# Patient Record
Sex: Male | Born: 1970 | Race: White | Hispanic: No | Smoking: Current every day smoker
Health system: Southern US, Community
[De-identification: ages and names within clinical notes are randomized; demographics above are authoritative.]

## PROBLEM LIST (undated history)

## (undated) ENCOUNTER — Emergency Department: Payer: Self-pay

## (undated) DIAGNOSIS — F419 Anxiety disorder, unspecified: Secondary | ICD-10-CM

## (undated) DIAGNOSIS — K589 Irritable bowel syndrome without diarrhea: Secondary | ICD-10-CM

## (undated) DIAGNOSIS — I10 Essential (primary) hypertension: Secondary | ICD-10-CM

## (undated) HISTORY — DX: Essential (primary) hypertension: I10

## (undated) HISTORY — PX: MOUTH SURGERY: SHX715

## (undated) HISTORY — DX: Anxiety disorder, unspecified: F41.9

## (undated) HISTORY — PX: VASECTOMY: SHX75

## (undated) HISTORY — PX: KNEE ARTHROSCOPY W/ ACL RECONSTRUCTION: SHX1858

---

## 2009-10-08 ENCOUNTER — Ambulatory Visit: Payer: Self-pay | Admitting: Family Medicine

## 2009-10-08 DIAGNOSIS — J069 Acute upper respiratory infection, unspecified: Secondary | ICD-10-CM | POA: Insufficient documentation

## 2009-10-08 DIAGNOSIS — R110 Nausea: Secondary | ICD-10-CM | POA: Insufficient documentation

## 2009-10-08 LAB — CONVERTED CEMR LAB: Glucose, Urine, Semiquant: NEGATIVE

## 2009-10-09 ENCOUNTER — Encounter: Payer: Self-pay | Admitting: Family Medicine

## 2009-10-09 LAB — CONVERTED CEMR LAB
ALT: 43 units/L (ref 0–53)
AST: 58 units/L — ABNORMAL HIGH (ref 0–37)
Alkaline Phosphatase: 69 units/L (ref 39–117)
Basophils Relative: 0 % (ref 0–1)
Chloride: 102 meq/L (ref 96–112)
Creatinine, Ser: 0.89 mg/dL (ref 0.40–1.50)
Glucose, Bld: 100 mg/dL — ABNORMAL HIGH (ref 70–99)
Hemoglobin: 15.3 g/dL (ref 13.0–17.0)
Lymphs Abs: 1.7 10*3/uL (ref 0.7–4.0)
MCHC: 34.7 g/dL (ref 30.0–36.0)
RBC: 5.02 M/uL (ref 4.22–5.81)
RDW: 12.4 % (ref 11.5–15.5)
Total Bilirubin: 0.5 mg/dL (ref 0.3–1.2)
Total Protein: 7.6 g/dL (ref 6.0–8.3)
WBC: 7.1 10*3/uL (ref 4.0–10.5)

## 2010-02-16 ENCOUNTER — Ambulatory Visit: Payer: Self-pay | Admitting: Family Medicine

## 2010-02-16 ENCOUNTER — Encounter: Admission: RE | Admit: 2010-02-16 | Discharge: 2010-02-16 | Payer: Self-pay | Admitting: Family Medicine

## 2010-05-11 ENCOUNTER — Ambulatory Visit: Payer: Self-pay | Admitting: Family Medicine

## 2010-05-11 DIAGNOSIS — H6500 Acute serous otitis media, unspecified ear: Secondary | ICD-10-CM | POA: Insufficient documentation

## 2010-05-11 DIAGNOSIS — H66009 Acute suppurative otitis media without spontaneous rupture of ear drum, unspecified ear: Secondary | ICD-10-CM | POA: Insufficient documentation

## 2010-05-11 DIAGNOSIS — H60339 Swimmer's ear, unspecified ear: Secondary | ICD-10-CM

## 2010-10-18 NOTE — Assessment & Plan Note (Signed)
Summary: RIGHT/LEFT EAR PAIN  HURTS WHEN YOU TOUCH IT (rm 3)   Vital Signs:  Patient Profile:   40 Years Old Male CC:      ear pain, sore throat x today Height:     69 inches Weight:      223 pounds O2 Sat:      98 % O2 treatment:    Room Air Temp:     98.6 degrees F oral Pulse rate:   86 / minute Resp:     12 per minute BP sitting:   119 / 83  (left arm) Cuff size:   large  Pt. in pain?   yes    Location:   both ears    Type:       sore  Vitals Entered By: Lajean Saver RN (May 11, 2010 4:30 PM)                   Updated Prior Medication List: No Medications Current Allergies (reviewed today): No known allergies History of Present Illness Chief Complaint: ear pain, sore throat x today History of Present Illness:  Subjective:  Patient complains of pain and drainage from his left ear for about 3 days.  He has been swimming recently.  Right ear now beginning to feel clogged.  Developing mild sore throat today.  No fevers, chills, and sweats.  Mild sinus congestion  REVIEW OF SYSTEMS Constitutional Symptoms      Denies fever, chills, night sweats, weight loss, weight gain, and fatigue.  Eyes       Denies change in vision, eye pain, eye discharge, glasses, contact lenses, and eye surgery. Ear/Nose/Throat/Mouth       Complains of ear pain and sore throat.      Denies hearing loss/aids, change in hearing, ear discharge, dizziness, frequent runny nose, frequent nose bleeds, sinus problems, hoarseness, and tooth pain or bleeding.      Comments: sore throat x today Respiratory       Denies dry cough, productive cough, wheezing, shortness of breath, asthma, bronchitis, and emphysema/COPD.  Cardiovascular       Denies murmurs, chest pain, and tires easily with exhertion.    Gastrointestinal       Denies stomach pain, nausea/vomiting, diarrhea, constipation, blood in bowel movements, and indigestion. Genitourniary       Denies painful urination, kidney stones, and loss of  urinary control. Neurological       Denies paralysis, seizures, and fainting/blackouts. Musculoskeletal       Denies muscle pain, joint pain, joint stiffness, decreased range of motion, redness, swelling, muscle weakness, and gout.  Skin       Denies bruising, unusual mles/lumps or sores, and hair/skin or nail changes.  Psych       Denies mood changes, temper/anger issues, anxiety/stress, speech problems, depression, and sleep problems. Other Comments: bilateral ear pain, mostly left, sore thoat x today   Past History:  Past Medical History: Reviewed history from 10/08/2009 and no changes required. Unremarkable  Past Surgical History: Reviewed history from 10/08/2009 and no changes required. Vasectomy Knee Surgery  Social History: Married Current Smoker 1PPD Alcohol use-no Drug use-no Regular exercise-no   Objective:  No acute distress  Eyes:  Pupils are equal, round, and reactive to light and accomdation.  Extraocular movement is intact.  Conjunctivae are not inflamed.  Ears:   Right external auditory canal.  Right tympanic membrane normal but has clear serous effusion Left external auditory canal has exudate and mildly edematous.  Left tympanic membrane erythematous Nose:  Congested turbinates but no sinus tenderness Pharynx:  Minimal erythema Neck:  Supple.  No adenopathy is present. Lungs:  Clear to auscultation.  Breath sounds are equal.  Heart:  Regular rate and rhythm without murmurs, rubs, or gallops.  Rapid strep test negative  Assessment New Problems: OTITIS MEDIA, SEROUS, ACUTE, RIGHT (ICD-381.01) OTITIS EXTERNA, ACUTE, LEFT (ICD-380.12) OTITIS MEDIA, SUPPURATIVE, ACUTE, LEFT (ICD-382.00)   Plan New Medications/Changes: AMOXICILLIN 875 MG TABS (AMOXICILLIN) One by mouth two times a day  #20 x 0, 05/11/2010, Donna Christen MD CIPRODEX 0.3-0.1 % SUSP (CIPROFLOXACIN-DEXAMETHASONE) 4 gtts in affected ear two times a day for 7 days  #7.5cc x 0, 05/11/2010,  Donna Christen MD  New Orders: Est. Patient Level III [54098] Rapid Strep [11914] Planning Comments:   Begin oral amoxicillin.  Ciprodex drops left ear for one week.  Expectorant/decongestant. Follow-up with PCP if not improving.   The patient and/or caregiver has been counseled thoroughly with regard to medications prescribed including dosage, schedule, interactions, rationale for use, and possible side effects and they verbalize understanding.  Diagnoses and expected course of recovery discussed and will return if not improved as expected or if the condition worsens. Patient and/or caregiver verbalized understanding.  Prescriptions: AMOXICILLIN 875 MG TABS (AMOXICILLIN) One by mouth two times a day  #20 x 0   Entered and Authorized by:   Donna Christen MD   Signed by:   Donna Christen MD on 05/11/2010   Method used:   Print then Give to Patient   RxID:   [id number] CIPRODEX 0.3-0.1 % SUSP (CIPROFLOXACIN-DEXAMETHASONE) 4 gtts in affected ear two times a day for 7 days  #7.5cc x 0   Entered and Authorized by:   Donna Christen MD   Signed by:   Donna Christen MD on 05/11/2010   Method used:   Print then Give to Patient   RxID:   [id number]   Patient Instructions: 1)  May use Mucinex D (guaifenesin with decongestant) twice daily for congestion. 2)  Increase fluid intake  3)  May use Afrin nasal spray (or generic oxymetazoline) twice daily for about 5 days.  Also recommend using saline nasal spray several times daily and/or saline nasal irrigation. 4)  Followup with family doctor if not improving one week.   Orders Added: 1)  Est. Patient Level III [02725] 2)  Rapid Strep [36644]
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
## 2010-10-18 NOTE — Letter (Signed)
Summary: Handout Printed  Printed Handout:  - Rheumatic Fever 

## 2010-10-18 NOTE — Assessment & Plan Note (Signed)
Summary: Stanley Munoz   Vital Signs:  Patient Profile:   40 Years Old Male CC:      Allergic reaction to Antibiotic - L side shaking O2 Sat:      97 % O2 treatment:    Room Air Temp:     97.0 degrees F oral Pulse rate:   83 / minute Pulse rhythm:   regular Resp:     16 per minute BP sitting:   127 / 87  (right arm) Cuff size:   regular  Vitals Entered By: Areta Haber CMA (October 08, 2009 7:12 PM)                  Current Allergies: No known allergies History of Present Illness Chief Complaint: Allergic reaction to Antibiotic - L side shaking History of Present Illness: Subjective:  Patient developed URI symptoms one week ago.  He visited his PCP 2 days ago and started on a Z-Pack.  Today while at work, he had "cotton mouth" all day, then about 3:30pm developed a frontal headache and had nausea and one episode of vomiting.  He feels somewhat weak, but the headache has resolved.  He denies fevers, chills, and sweats.  No localizing neuro symptoms.  He still has a mild cough and nasal congestion.  He states that his ears are ringing and feel congested.  No urinary symptoms.  No chest pain.  Current Problems: NAUSEA ALONE (ICD-787.02) URI (ICD-465.9)   Current Meds ZITHROMAX Z-PAK 250 MG TABS (AZITHROMYCIN) as directed  REVIEW OF SYSTEMS Constitutional Symptoms      Denies fever, chills, night sweats, weight loss, weight gain, and fatigue.  Eyes       Denies change in vision, eye pain, eye discharge, glasses, contact lenses, and eye surgery. Ear/Nose/Throat/Mouth       Denies hearing loss/aids, change in hearing, ear pain, ear discharge, dizziness, frequent runny nose, frequent nose bleeds, sinus problems, sore throat, hoarseness, and tooth pain or bleeding.  Respiratory       Denies dry cough, productive cough, wheezing, shortness of breath, asthma, bronchitis, and emphysema/COPD.  Cardiovascular       Denies murmurs, chest pain, and tires  easily with exhertion.    Gastrointestinal       Denies stomach pain, nausea/vomiting, diarrhea, constipation, blood in bowel movements, and indigestion. Genitourniary       Denies painful urination, kidney stones, and loss of urinary control. Neurological       Denies paralysis, seizures, and fainting/blackouts. Musculoskeletal       Denies muscle pain, joint pain, joint stiffness, decreased range of motion, redness, swelling, muscle weakness, and gout.  Skin       Denies bruising, unusual mles/lumps or sores, and hair/skin or nail changes.  Psych       Denies mood changes, temper/anger issues, anxiety/stress, speech problems, depression, and sleep problems. Other Comments: Pt states that he started shaking on his Left side this afternoon and did not know if he was having an allergic reaction to antibiotic that he was prescribed by PCP on Wednesday 10/06/09.   Past History:  Past Medical History: Unremarkable  Past Surgical History: Vasectomy Knee Surgery  Social History: Married Current Smoker Alcohol use-no Drug use-no Regular exercise-no Smoking Status:  current Drug Use:  no Does Patient Exercise:  no   Objective:  Appearance:  Patient appears healthy, stated age, and in no acute distress  Eyes:  Pupils are equal, round, and reactive to light and  accomdation.  Extraocular movement is intact.  Conjunctivae are not inflamed.  Ears:  Canals normal.  Tympanic membranes normal.   Nose:  Normal septum.  Normal turbinates, mildly congested.    No sinus tenderness present.  Pharynx:  Normal, moist mucous membranes  Neck:  Supple.  No adenopathy is present.  No thyromegaly is present  Lungs:  Clear to auscultation.  Breath sounds are equal.  Heart:  Regular rate and rhythm without murmurs, rubs, or gallops.  Abdomen:  Nontender without masses or hepatosplenomegaly.  Bowel sounds are present.  No CVA or flank tenderness.  Extremities:  No edema.   urinalysis (dipstick):  normal except specific gravity greater than 1.030 Assessment New Problems: NAUSEA ALONE (ICD-787.02) URI (ICD-465.9)  Suspect dehydration as a cause of his symptoms.  Plan New Orders: T-CBC w/Diff [[id number]] T-Comprehensive Metabolic Panel [[id number]] Urinalysis [81003-65000] New Patient Level III [64403] Planning Comments:   For now stop the Z-pack.  Check CBC and CMP. Rest tonight and push fluids.  If symptoms become significantly worse during the night or over the weekend, proceed to the local emergency room.   The patient and/or caregiver has been counseled thoroughly with regard to medications prescribed including dosage, schedule, interactions, rationale for use, and Stanley side effects and they verbalize understanding.  Diagnoses and expected course of recovery discussed and will return if not improved as expected or if the condition worsens. Patient and/or caregiver verbalized understanding.   Laboratory Results   Urine Tests  Date/Time Received: October 08, 2009 7:23 PM  Date/Time Reported: October 08, 2009 7:23 PM   Routine Urinalysis   Color: yellow Appearance: Cloudy Glucose: negative   (Normal Range: Negative) Bilirubin: small   (Normal Range: Negative) Ketone: negative   (Normal Range: Negative) Spec. Gravity: >=1.030   (Normal Range: 1.003-1.035) Blood: negative   (Normal Range: Negative) pH: 6.0   (Normal Range: 5.0-8.0) Protein: 30   (Normal Range: Negative) Urobilinogen: 1.0   (Normal Range: 0-1) Nitrite: negative   (Normal Range: Negative) Leukocyte Esterace: negative   (Normal Range: Negative)

## 2010-10-18 NOTE — Assessment & Plan Note (Signed)
Summary: New Antibiotic  Followup call to patient:  He feels better after drinking plenty of fluids, but still has cough.  No fever.  Discussed lab results (normal CBC and unremarkable CMP except slightly elevated glucose and AST)  Assessment:   Suspect viral URI/? bronchitis  Plan:  AMOXICILLIN 875 MG TABS (AMOXICILLIN) One by mouth two times a day Rest, continue increased fluids.  Follow-up with PCP if not resolved one week.  Prescriptions: AMOXICILLIN 875 MG TABS (AMOXICILLIN) One by mouth two times a day  #14 x 0   Entered and Authorized by:   Donna Christen MD   Signed by:   Donna Christen MD on 10/09/2009   Method used:   Electronically to        [facility] [phone]* (retail)       [address] [place], [place]  [address]       Ph: [phone] or [phone]       Fax: [phone]   RxID:   [id number]
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
## 2011-10-16 ENCOUNTER — Encounter: Payer: Self-pay | Admitting: *Deleted

## 2011-10-16 ENCOUNTER — Emergency Department (INDEPENDENT_AMBULATORY_CARE_PROVIDER_SITE_OTHER)
Admission: EM | Admit: 2011-10-16 | Discharge: 2011-10-16 | Disposition: A | Discharge status: Home / Self Care | Payer: BC Managed Care – PPO | Source: Home / Self Care | Attending: Family Medicine | Admitting: Family Medicine

## 2011-10-16 DIAGNOSIS — R109 Unspecified abdominal pain: Secondary | ICD-10-CM

## 2011-10-16 DIAGNOSIS — R5383 Other fatigue: Secondary | ICD-10-CM

## 2011-10-16 LAB — POCT CBC W AUTO DIFF (K'VILLE URGENT CARE)

## 2011-10-16 LAB — POCT URINALYSIS DIPSTICK
Bilirubin, UA: NEGATIVE
Nitrite, UA: NEGATIVE

## 2011-10-16 NOTE — ED Provider Notes (Signed)
History     CSN: [id number]  Arrival date & time 10/16/11  1135   First MD Initiated Contact with Patient 10/16/11 1240      Chief Complaint  Patient presents with  . Weakness     HPI Comments: Patient complains of awakening 5 days ago with nausea at 4AM.  Later at midday after lunch he vomited once.  His stools have been slightly loose, but not diarrhea, and he has had mild abdominal discomfort.  His appetite is decreased.  For about 4 days he had a low grade fever to 100, but not for the past 24 hours.  He has had some mild nasal congestion and cough.  No urinary symptoms.  No rash.  His main complaint now is persistent fatigue.  Patient is a 41 y.o. male presenting with weakness. The history is provided by the patient.  Weakness The primary symptoms include headaches, fever, nausea and vomiting. Primary symptoms do not include syncope, loss of consciousness, altered mental status, dizziness, visual change or focal weakness. Episode onset: 5 days ago. The symptoms are unchanged. The neurological symptoms are diffuse.  The headache is associated with weakness. The headache is not associated with visual change.  Additional symptoms include weakness.    History reviewed. No pertinent past medical history.  Past Surgical History  Procedure Date  . Vasectomy   . Knee arthroscopy w/ acl reconstruction     left    No family history on file.  History  Substance Use Topics  . Smoking status: Current Everyday Smoker -- 25 years  . Smokeless tobacco: Not on file  . Alcohol Use: No      Review of Systems  Constitutional: Positive for fever.  Cardiovascular: Negative for syncope.  Gastrointestinal: Positive for nausea and vomiting.  Neurological: Positive for weakness and headaches. Negative for dizziness, focal weakness and loss of consciousness.  Psychiatric/Behavioral: Negative for altered mental status.   No sore throat Minimal cough No pleuritic pain No wheezing Minimal  nasal congestion No post-nasal drainage No sinus pain/pressure No itchy/red eyes No earache No hemoptysis No SOB + fever/chills to 100 + nausea, resolved + vomiting, resolved + abdominal pain, mild No diarrhea, but stools somewhat loose No urinary symptoms No skin rashes + fatigue No myalgias + headache Used OTC meds without relief  Allergies  Review of patient's allergies indicates no known allergies.  Home Medications  No current outpatient prescriptions on file.  BP 126/84  Pulse 75  Temp(Src) 98.7 F (37.1 C) (Oral)  Resp 14  Ht 5\' 9"  (1.753 m)  Wt 234 lb (106.142 kg)  BMI 34.56 kg/m2  SpO2 96%  Physical Exam Nursing notes and Vital Signs reviewed. Appearance:  Patient appears healthy, stated age, and in no acute distress Eyes:  Pupils are equal, round, and reactive to light and accomodation.  Extraocular movement is intact.  Conjunctivae are not inflamed  Ears:  Canals normal.  Tympanic membranes normal.  Nose:  Mildly congested turbinates.  No sinus tenderness.  Pharynx:  Normal Neck:  Supple. No adenopathy Lungs:  Clear to auscultation.  Breath sounds are equal.  Heart:  Regular rate and rhythm without murmurs, rubs, or gallops.  Abdomen:   No masses or hepatosplenomegaly.  There is mild tenderness over the spleen and right lower quadrant.  Bowel sounds are present.  No CVA or flank tenderness.  Extremities:  No edema.  No calf tenderness Lymph:  There is mild tenderness bilateral lateral axillary nodes. Skin:  No rash  present.   ED Course  Procedures  none   Labs Reviewed  POCT CBC W AUTO DIFF ([facility]) CBC:  WBC 6.0; LY 35.2; MO 8.8; GR 56.0; Hgb 16.0   POCT URINALYSIS DIPSTICK negative      1. Fatigue   2. Abdominal pain       MDM  Suspect a resolving viral syndrome. Treat symptomatically for now:  Continue a BRAT diet and gradually advance as tolerated.  Rest. Followup with PCP if not improving one week, or if symptoms  worsen.        Donna Christen, MD 10/16/11 [phone]

## 2011-10-16 NOTE — ED Notes (Signed)
Patient c/o vomiting once 5 days ago. He then developed a fever that lasted 3 days and weakness that still persists. Bowel movement pattern is normal and appetite/intake is normal.

## 2012-02-15 ENCOUNTER — Encounter: Payer: Self-pay | Admitting: *Deleted

## 2012-02-15 ENCOUNTER — Emergency Department (INDEPENDENT_AMBULATORY_CARE_PROVIDER_SITE_OTHER)
Admission: EM | Admit: 2012-02-15 | Discharge: 2012-02-15 | Disposition: A | Discharge status: Home / Self Care | Payer: BC Managed Care – PPO | Source: Home / Self Care | Attending: Emergency Medicine | Admitting: Emergency Medicine

## 2012-02-15 DIAGNOSIS — M545 Low back pain, unspecified: Secondary | ICD-10-CM

## 2012-02-15 MED ORDER — CYCLOBENZAPRINE HCL 10 MG PO TABS: 10.0000 mg | ORAL_TABLET | Freq: Three times a day (TID) | ORAL | Status: AC | PRN

## 2012-02-15 MED ORDER — TRAMADOL HCL 50 MG PO TABS: 50.0000 mg | ORAL_TABLET | Freq: Four times a day (QID) | ORAL | Status: AC | PRN

## 2012-02-15 MED ORDER — PREDNISONE 20 MG PO TABS: 20.0000 mg | ORAL_TABLET | Freq: Two times a day (BID) | ORAL | Status: AC

## 2012-02-15 NOTE — ED Provider Notes (Signed)
History     CSN: [id number]  Arrival date & time 02/15/12  0921   First MD Initiated Contact with Patient 02/15/12 0940      Chief Complaint  Patient presents with  . Back Pain    (Consider location/radiation/quality/duration/timing/severity/associated sxs/prior treatment) HPI The patient presents today with back pain.  He was lifting a 20 lb piece of furniture a few days ago and then developed lower left back & buttock pain.  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 Location: L lower back Timing: constant Description: tight, spasm, sharp Worse with: moving, lifting Better with: rest, massage, hot tub, Tylenol Trauma: no Bladder/bowel incontinence: no Weakness: no Fever/chills: no Night pain: no Unexplained weight loss: no Cancer/immunosuppression: no PMH of osteoporosis or chronic steroid use:  no   History reviewed. No pertinent past medical history.  Past Surgical History  Procedure Date  . Vasectomy   . Knee arthroscopy w/ acl reconstruction     left    History reviewed. No pertinent family history.  History  Substance Use Topics  . Smoking status: Current Everyday Smoker -- 25 years  . Smokeless tobacco: Not on file  . Alcohol Use: No      Review of Systems  All other systems reviewed and are negative.    Allergies  Review of patient's allergies indicates no known allergies.  Home Medications  No current outpatient prescriptions on file.  BP 117/84  Pulse 78  Resp 14  Ht 5\' 9"  (1.753 m)  Wt 235 lb (106.595 kg)  BMI 34.70 kg/m2  SpO2 99%  Physical Exam  Nursing note and vitals reviewed. Constitutional: He is oriented to person, place, and time. He appears well-developed and well-nourished.  HENT:  Head: Normocephalic and atraumatic.  Eyes: No scleral icterus.  Neck: Neck supple.  Cardiovascular: Regular rhythm and normal heart sounds.   Pulmonary/Chest: Effort normal and breath sounds normal. No respiratory  distress.  Musculoskeletal:       Lumbar back: He exhibits tenderness, pain and spasm. He exhibits no bony tenderness, no swelling and no edema.       Back:       SLR negative bilateral, distal pulses & sensation normal.  Neurological: He is alert and oriented to person, place, and time. He has normal strength and normal reflexes. No sensory deficit.  Skin: Skin is warm and dry. No rash noted.  Psychiatric: He has a normal mood and affect. His speech is normal.    ED Course  Procedures (including critical care time)  Labs Reviewed - No data to display No results found.   1. Pain in lower back       MDM   This patient is having musculoskeletal lower back pain.  I gave him a prescription for Flexeril, tramadol, and prednisone for a few days.  I gave him a work note for yesterday through tomorrow and told him to avoid heavy lifting, pushing, pulling.  I would like him to continue to sit in his hot or use a heating pad, encourage massage, gentle range of motion.  It is not improving, PT versus referral to sports medicine.  I advised him that this will likely be going on matter what for another 5-7 days despite treatment anyway.     Marlaine Hind, MD 02/15/12 [phone]

## 2012-02-15 NOTE — ED Notes (Signed)
patient was moving furniture 2 days ago and felt a catch in his left lower back. C/o left glute pain that radiates on his left leg. Taken tylenol for the pain.

## 2012-06-06 ENCOUNTER — Emergency Department (INDEPENDENT_AMBULATORY_CARE_PROVIDER_SITE_OTHER)
Admission: EM | Admit: 2012-06-06 | Discharge: 2012-06-06 | Disposition: A | Discharge status: Home / Self Care | Payer: BC Managed Care – PPO | Source: Home / Self Care | Attending: Family Medicine | Admitting: Family Medicine

## 2012-06-06 DIAGNOSIS — J209 Acute bronchitis, unspecified: Secondary | ICD-10-CM

## 2012-06-06 MED ORDER — AZITHROMYCIN 250 MG PO TABS: ORAL_TABLET | ORAL | Status: DC

## 2012-06-06 MED ORDER — PREDNISONE 20 MG PO TABS: 20.0000 mg | ORAL_TABLET | Freq: Two times a day (BID) | ORAL | Status: DC

## 2012-06-06 MED ORDER — BENZONATATE 200 MG PO CAPS: 200.0000 mg | ORAL_CAPSULE | Freq: Every day | ORAL | Status: DC

## 2012-06-06 NOTE — ED Notes (Signed)
Stanley Munoz complains of a productive cough for 2 days and now has wheezing. Denies fever, chills or sweats.

## 2012-06-06 NOTE — ED Provider Notes (Signed)
History     CSN: [id number]  Arrival date & time 06/06/12  1216   First MD Initiated Contact with Patient 06/06/12 1229      Chief Complaint  Patient presents with  . Wheezing    x 1 day  . Cough    x 2days      HPI Comments: Patient complains of 2 day history of gradually progressive URI symptoms beginning with nasal congestion and a cough.  No sore throat.  Complains of fatigue but no myalgias.  Cough is now worse at night and generally non-productive during the day.  There has been no pleuritic pain, but he has developed wheezing and mild shortness of breath with activity today.  He continues to smoke.   History reviewed. No pertinent past medical history.  Past Surgical History  Procedure Date  . Vasectomy   . Knee arthroscopy w/ acl reconstruction     left    History reviewed. No pertinent family history.  History  Substance Use Topics  . Smoking status: Current Every Day Smoker -- 1.0 packs/day for 28 years  . Smokeless tobacco: Never Used  . Alcohol Use: No      Review of Systems No sore throat + cough No pleuritic pain + wheezing No nasal congestion No post-nasal drainage No sinus pain/pressure No itchy/red eyes No earache No hemoptysis + mild SOB with activity ? Low grade fever  No nausea No vomiting No abdominal pain No diarrhea No urinary symptoms No skin rashes + fatigue No myalgias + headache Used OTC meds without relief  Allergies  Review of patient's allergies indicates no known allergies.  Home Medications   Current Outpatient Rx  Name Route Sig Dispense Refill  . AZITHROMYCIN 250 MG PO TABS  Take 2 tabs today; then begin one tab once daily for 4 more days. 6 each 0  . BENZONATATE 200 MG PO CAPS Oral Take 1 capsule (200 mg total) by mouth at bedtime. Take as needed for cough 12 capsule 0  . PREDNISONE 20 MG PO TABS Oral Take 1 tablet (20 mg total) by mouth 2 (two) times daily. Take with food. 10 tablet 0    BP 112/77  Pulse  95  Temp 98.5 F (36.9 C) (Oral)  Resp 18  Ht 5\' 9"  (1.753 m)  Wt 236 lb (107.049 kg)  BMI 34.85 kg/m2  SpO2 96%  Physical Exam Nursing notes and Vital Signs reviewed. Appearance:  Patient appears healthy, stated age, and in no acute distress Eyes:  Pupils are equal, round, and reactive to light and accomodation.  Extraocular movement is intact.  Conjunctivae are not inflamed  Ears:  Canals normal.  Tympanic membranes normal.  Nose:   Nasal clear.  No sinus tenderness.  Pharynx:  Normal Neck:  Supple.   No adenopathy Lungs:   Faint scattered wheezes bilaterally.  Breath sounds are equal.  Heart:  Regular rate and rhythm without murmurs, rubs, or gallops.  Abdomen:  Nontender without masses or hepatosplenomegaly.  Bowel sounds are present.  No CVA or flank tenderness.  Extremities:  No edema.  No calf tenderness Skin:  No rash present.   ED Course  Procedures  none      1. Bronchospasm with bronchitis, acute       MDM  ? Atypical agent. Begin a Z-pack, prednisone burst.  Prescription written for Benzonatate (Tessalon) to take at bedtime for night-time cough.  Take Mucinex D (guaifenesin with decongestant) twice daily for congestion.  Increase fluid  intake, rest. May use Afrin nasal spray (or generic oxymetazoline) twice daily for about 5 days.  Also recommend using saline nasal spray several times daily and saline nasal irrigation (AYR is a common brand) Stop all antihistamines for now, and other non-prescription cough/cold preparations. Recommend smoking cessation. Follow-up with family doctor if not improving 7 to 10 days.         Lattie Haw, MD 06/06/12 [phone]

## 2013-12-15 ENCOUNTER — Encounter: Payer: Self-pay | Admitting: Emergency Medicine

## 2013-12-15 ENCOUNTER — Emergency Department
Admission: EM | Admit: 2013-12-15 | Discharge: 2013-12-15 | Disposition: A | Discharge status: Home / Self Care | Payer: BC Managed Care – PPO | Source: Home / Self Care | Attending: Family Medicine | Admitting: Family Medicine

## 2013-12-15 DIAGNOSIS — R3915 Urgency of urination: Secondary | ICD-10-CM

## 2013-12-15 HISTORY — DX: Irritable bowel syndrome, unspecified: K58.9

## 2013-12-15 LAB — POCT CBC W AUTO DIFF (K'VILLE URGENT CARE)

## 2013-12-15 LAB — COMPLETE METABOLIC PANEL WITH GFR
ALBUMIN: 4.3 g/dL (ref 3.5–5.2)
ALT: 35 U/L (ref 0–53)
AST: 43 U/L — AB (ref 0–37)
Alkaline Phosphatase: 72 U/L (ref 39–117)
BILIRUBIN TOTAL: 0.4 mg/dL (ref 0.2–1.2)
BUN: 12 mg/dL (ref 6–23)
CALCIUM: 9.6 mg/dL (ref 8.4–10.5)
CO2: 29 mEq/L (ref 19–32)
CREATININE: 0.93 mg/dL (ref 0.50–1.35)
Chloride: 102 mEq/L (ref 96–112)
GFR, Est African American: >89 mL/min
GLUCOSE: 85 mg/dL (ref 70–99)
POTASSIUM: 5 meq/L (ref 3.5–5.3)
Sodium: 138 mEq/L (ref 135–145)
Total Protein: 7 g/dL (ref 6.0–8.3)

## 2013-12-15 LAB — POCT URINALYSIS DIP (MANUAL ENTRY)
BILIRUBIN UA: NEGATIVE
Blood, UA: NEGATIVE
Glucose, UA: NEGATIVE
Ketones, POC UA: NEGATIVE
Leukocytes, UA: NEGATIVE
Nitrite, UA: NEGATIVE
PROTEIN UA: NEGATIVE
Spec Grav, UA: >=1.03 (ref 1.005–1.03)
Urobilinogen, UA: 0.2 (ref 0–1)
pH, UA: 5.5 (ref 5–8)

## 2013-12-15 LAB — CK: Total CK: 117 U/L (ref 7–232)

## 2013-12-15 NOTE — ED Notes (Signed)
Pt c/o frequent urination x 2-3 days, with some bowel changes. Denies pain, burning, or fever.
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
## 2013-12-16 LAB — URINE CULTURE
COLONY COUNT: NO GROWTH
Organism ID, Bacteria: NO GROWTH

## 2013-12-19 ENCOUNTER — Telehealth: Payer: Self-pay | Admitting: *Deleted

## 2014-09-09 ENCOUNTER — Emergency Department
Admission: EM | Admit: 2014-09-09 | Discharge: 2014-09-09 | Disposition: A | Discharge status: Home / Self Care | Payer: BC Managed Care – PPO | Source: Home / Self Care | Attending: Emergency Medicine | Admitting: Emergency Medicine

## 2014-09-09 ENCOUNTER — Ambulatory Visit (HOSPITAL_BASED_OUTPATIENT_CLINIC_OR_DEPARTMENT_OTHER): Admission: RE | Admit: 2014-09-09 | Payer: BC Managed Care – PPO | Source: Ambulatory Visit

## 2014-09-09 ENCOUNTER — Ambulatory Visit (HOSPITAL_BASED_OUTPATIENT_CLINIC_OR_DEPARTMENT_OTHER)
Admission: RE | Admit: 2014-09-09 | Discharge: 2014-09-09 | Disposition: A | Discharge status: Home / Self Care | Payer: BC Managed Care – PPO | Source: Ambulatory Visit | Attending: Emergency Medicine | Admitting: Emergency Medicine

## 2014-09-09 ENCOUNTER — Encounter: Payer: Self-pay | Admitting: *Deleted

## 2014-09-09 ENCOUNTER — Other Ambulatory Visit: Payer: Self-pay | Admitting: Emergency Medicine

## 2014-09-09 DIAGNOSIS — R208 Other disturbances of skin sensation: Secondary | ICD-10-CM

## 2014-09-09 DIAGNOSIS — Z87891 Personal history of nicotine dependence: Secondary | ICD-10-CM | POA: Diagnosis not present

## 2014-09-09 DIAGNOSIS — M25451 Effusion, right hip: Secondary | ICD-10-CM

## 2014-09-09 DIAGNOSIS — M79651 Pain in right thigh: Secondary | ICD-10-CM | POA: Insufficient documentation

## 2014-09-09 DIAGNOSIS — I8001 Phlebitis and thrombophlebitis of superficial vessels of right lower extremity: Secondary | ICD-10-CM

## 2014-09-09 DIAGNOSIS — M79604 Pain in right leg: Secondary | ICD-10-CM

## 2014-09-09 DIAGNOSIS — R200 Anesthesia of skin: Secondary | ICD-10-CM
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
## 2014-09-09 NOTE — ED Notes (Signed)
Pt c/o swelling and numbness in his RT upper leg x 1 day with a "tender spot".

## 2015-02-10 ENCOUNTER — Other Ambulatory Visit: Payer: Self-pay | Admitting: Sports Medicine

## 2015-02-10 ENCOUNTER — Emergency Department
Admission: EM | Admit: 2015-02-10 | Discharge: 2015-02-10 | Disposition: A | Discharge status: Home / Self Care | Payer: BLUE CROSS/BLUE SHIELD | Source: Home / Self Care | Attending: Family Medicine | Admitting: Family Medicine

## 2015-02-10 ENCOUNTER — Encounter: Payer: Self-pay | Admitting: *Deleted

## 2015-02-10 ENCOUNTER — Ambulatory Visit: Payer: BLUE CROSS/BLUE SHIELD

## 2015-02-10 DIAGNOSIS — M25451 Effusion, right hip: Secondary | ICD-10-CM | POA: Diagnosis not present

## 2015-02-10 DIAGNOSIS — M79651 Pain in right thigh: Secondary | ICD-10-CM | POA: Diagnosis not present

## 2015-02-10 MED ORDER — DOXYCYCLINE HYCLATE 100 MG PO TABS: 100.0000 mg | ORAL_TABLET | Freq: Two times a day (BID) | ORAL | Status: AC

## 2015-02-10 MED ORDER — ASPIRIN 325 MG PO TBEC: 325.0000 mg | DELAYED_RELEASE_TABLET | Freq: Three times a day (TID) | ORAL | Status: DC
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
## 2015-02-10 NOTE — Assessment & Plan Note (Signed)
History of superficial thrombophlebitis. We do need an official ultrasound of the right thigh both superficial and deep veins up to the iliacs and the saphenofemoral junction. High-dose aspirin. Thigh-high compression hose.  Return to see me in 2 weeks. If persistent symptoms we will obtain an MR venogram.
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
## 2015-02-18 ENCOUNTER — Telehealth: Payer: Self-pay | Admitting: Emergency Medicine

## 2016-01-27 ENCOUNTER — Ambulatory Visit (HOSPITAL_BASED_OUTPATIENT_CLINIC_OR_DEPARTMENT_OTHER): Payer: BLUE CROSS/BLUE SHIELD | Attending: Emergency Medicine

## 2016-01-27 ENCOUNTER — Emergency Department
Admission: EM | Admit: 2016-01-27 | Discharge: 2016-01-27 | Disposition: A | Discharge status: Home / Self Care | Payer: BLUE CROSS/BLUE SHIELD | Source: Home / Self Care | Attending: Family Medicine | Admitting: Family Medicine

## 2016-01-27 ENCOUNTER — Encounter: Payer: Self-pay | Admitting: *Deleted

## 2016-01-27 ENCOUNTER — Emergency Department (INDEPENDENT_AMBULATORY_CARE_PROVIDER_SITE_OTHER): Payer: BLUE CROSS/BLUE SHIELD

## 2016-01-27 DIAGNOSIS — R0789 Other chest pain: Secondary | ICD-10-CM

## 2016-01-27 DIAGNOSIS — R209 Unspecified disturbances of skin sensation: Secondary | ICD-10-CM | POA: Diagnosis not present

## 2016-01-27 DIAGNOSIS — M79602 Pain in left arm: Secondary | ICD-10-CM | POA: Diagnosis not present

## 2016-01-27 DIAGNOSIS — M546 Pain in thoracic spine: Secondary | ICD-10-CM

## 2016-01-27 DIAGNOSIS — F410 Panic disorder [episodic paroxysmal anxiety] without agoraphobia: Secondary | ICD-10-CM

## 2016-01-27 DIAGNOSIS — R200 Anesthesia of skin: Secondary | ICD-10-CM

## 2016-01-27 DIAGNOSIS — R202 Paresthesia of skin: Principal | ICD-10-CM

## 2016-01-27 DIAGNOSIS — M549 Dorsalgia, unspecified: Secondary | ICD-10-CM

## 2016-01-27 LAB — POCT CBC W AUTO DIFF (K'VILLE URGENT CARE)

## 2016-01-27 MED ORDER — MELOXICAM 7.5 MG PO TABS: 7.5000 mg | ORAL_TABLET | Freq: Every day | ORAL | Status: DC

## 2016-01-27 MED ORDER — CYCLOBENZAPRINE HCL 10 MG PO TABS: 10.0000 mg | ORAL_TABLET | Freq: Two times a day (BID) | ORAL | Status: DC | PRN

## 2016-01-27 MED ORDER — PREDNISONE 20 MG PO TABS: ORAL_TABLET | ORAL | Status: DC
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
## 2016-01-27 NOTE — ED Notes (Signed)
Pt c/o 3 days of numbness, tingling and pain in his left hand that initially started with swelling. As the swelling decreased the discomfort traveled up his arm and is not in his upper side/left back. Denies SOB or CP. He also reports awaking with tremors in the morning which resolve after a few minutes.

## 2016-01-27 NOTE — Discharge Instructions (Signed)
Meloxicam (Mobic) is an antiinflammatory to help with pain and inflammation.  Do not take ibuprofen, Advil, Aleve, or any other medications that contain NSAIDs while taking meloxicam as this may cause stomach upset or even ulcers if taken in large amounts for an extended period of time.   Prednisone is a steroid being prescribed.  This medication can help decrease inflammation in your muscles and hopefully help with some of the back pain.   Flexeril is a muscle relaxer and may cause drowsiness. Do not drink alcohol, drive, or operate heavy machinery while taking.  Flexeril is NOT a narcotic medication.  Low risk of dependency.

## 2016-01-28 ENCOUNTER — Telehealth: Payer: Self-pay | Admitting: *Deleted

## 2016-01-28 LAB — COMPLETE METABOLIC PANEL WITH GFR
ALT: 20 U/L (ref 9–46)
AST: 33 U/L (ref 10–40)
Albumin: 4.1 g/dL (ref 3.6–5.1)
Alkaline Phosphatase: 74 U/L (ref 40–115)
BUN: 10 mg/dL (ref 7–25)
CO2: 28 mmol/L (ref 20–31)
Calcium: 9.2 mg/dL (ref 8.6–10.3)
Chloride: 103 mmol/L (ref 98–110)
Creat: 0.95 mg/dL (ref 0.60–1.35)
GFR, Est African American: >89 mL/min (ref >=60)
GFR, Est Non African American: >89 mL/min (ref >=60)
Glucose, Bld: 84 mg/dL (ref 65–99)
Potassium: 4.6 mmol/L (ref 3.5–5.3)
Sodium: 139 mmol/L (ref 135–146)
Total Bilirubin: 0.3 mg/dL (ref 0.2–1.2)
Total Protein: 6.8 g/dL (ref 6.1–8.1)

## 2016-01-28 LAB — SEDIMENTATION RATE: Sed Rate: 11 mm/hr (ref 0–15)

## 2016-01-28 LAB — TSH: TSH: 2.95 mIU/L (ref 0.40–4.50)

## 2016-01-28 LAB — C-REACTIVE PROTEIN: CRP: 0.9 mg/dL — ABNORMAL HIGH (ref <0.60)

## 2016-08-17 ENCOUNTER — Encounter: Payer: Self-pay | Admitting: Emergency Medicine

## 2016-08-17 ENCOUNTER — Emergency Department
Admission: EM | Admit: 2016-08-17 | Discharge: 2016-08-17 | Disposition: A | Discharge status: Home / Self Care | Payer: BLUE CROSS/BLUE SHIELD | Source: Home / Self Care | Attending: Family Medicine | Admitting: Family Medicine

## 2016-08-17 DIAGNOSIS — B9789 Other viral agents as the cause of diseases classified elsewhere: Secondary | ICD-10-CM | POA: Diagnosis not present

## 2016-08-17 DIAGNOSIS — J069 Acute upper respiratory infection, unspecified: Secondary | ICD-10-CM

## 2016-08-17 MED ORDER — GUAIFENESIN-CODEINE 100-10 MG/5ML PO SOLN: ORAL | 0 refills | Status: DC

## 2016-08-17 MED ORDER — AZITHROMYCIN 250 MG PO TABS: ORAL_TABLET | ORAL | 0 refills | Status: DC

## 2016-08-17 NOTE — Discharge Instructions (Signed)
Take plain guaifenesin (1200mg  extended release tabs such as Mucinex) twice daily, with plenty of water, for cough and congestion.  May add Pseudoephedrine (30mg , one or two every 4 to 6 hours) for sinus congestion.  Get adequate rest.   Continue Mucinex nasal spray twice daily for about 5 days and then discontinue.  Also recommend using saline nasal spray several times daily and saline nasal irrigation (AYR is a common brand).  Use Flonase nasal spray each morning after using Afrin nasal spray and saline nasal irrigation. Try warm salt water gargles for sore throat.  Stop all antihistamines for now, and other non-prescription cough/cold preparations. May take Ibuprofen 200mg , 4 tabs every 8 hours with food for body aches, headache, etc.   Follow-up with family doctor if not improving about10 days.

## 2016-08-17 NOTE — ED Provider Notes (Signed)
[facility]    CSN: [id number] Arrival date & time: 08/17/16  1550     History   Chief Complaint Chief Complaint  Patient presents with  . Cough  . Nasal Congestion    HPI Stanley Munoz is a 45 y.o. male.   Patient complains of six day history of typical cold-like symptoms developing over several days, including mild sore throat, sinus congestion, headache, fatigue, low grade fever/sweats, and cough.  His nocturnal sweats have persisted.   The history is provided by the patient.    Past Medical History:  Diagnosis Date  . IBS (irritable bowel syndrome)     Patient Active Problem List   Diagnosis Date Noted  . Right thigh pain 02/10/2015  . OTITIS EXTERNA, ACUTE, LEFT 05/11/2010  . OTITIS MEDIA, SEROUS, ACUTE, RIGHT 05/11/2010  . OTITIS MEDIA, SUPPURATIVE, ACUTE, LEFT 05/11/2010  . URI 10/08/2009  . NAUSEA ALONE 10/08/2009    Past Surgical History:  Procedure Laterality Date  . KNEE ARTHROSCOPY W/ ACL RECONSTRUCTION     left  . MOUTH SURGERY    . VASECTOMY         Home Medications    Prior to Admission medications   Medication Sig Start Date End Date Taking? Authorizing Provider  azithromycin (ZITHROMAX Z-PAK) 250 MG tablet Take 2 tabs today; then begin one tab once daily for 4 more days. 08/17/16   Lattie HawStephen A Crews Mccollam, MD  guaiFENesin-codeine 100-10 MG/5ML syrup Take 10mL by mouth at bedtime as needed for cough 08/17/16   Lattie HawStephen A Chonte Ricke, MD    Family History Family History  Problem Relation Age of Onset  . Cancer Mother     breast  . Heart disease Father     Social History Social History  Substance Use Topics  . Smoking status: Current Every Day Smoker    Packs/day: 1.00    Years: 28.00    Types: Cigarettes  . Smokeless tobacco: Never Used  . Alcohol use Yes     Comment: occassionally     Allergies   Patient has no known allergies.   Review of Systems Review of Systems  + sore throat + cough No pleuritic pain No  wheezing + nasal congestion + post-nasal drainage No sinus pain/pressure No itchy/red eyes ? Earache; ears feel clogged No hemoptysis No SOB No fever, + chills/sweats No nausea No vomiting No abdominal pain No diarrhea No urinary symptoms No skin rash + fatigue + myalgias + headache Used OTC meds without relief    Physical Exam Triage Vital Signs ED Triage Vitals  Enc Vitals Group     BP 08/17/16 1701 124/81     Pulse Rate 08/17/16 1701 76     Resp 08/17/16 1701 16     Temp 08/17/16 1701 98.2 F (36.8 C)     Temp Source 08/17/16 1701 Oral     SpO2 08/17/16 1701 97 %     Weight 08/17/16 1702 275 lb (124.7 kg)     Height 08/17/16 1702 5\' 9"  (1.753 m)     Head Circumference --      Peak Flow --      Pain Score --      Pain Loc --      Pain Edu? --      Excl. in GC? --    No data found.   Updated Vital Signs BP 124/81 (BP Location: Left Arm)   Pulse 76   Temp 98.2 F (36.8 C) (Oral)   Resp  16   Ht 5\' 9"  (1.753 m)   Wt 275 lb (124.7 kg)   SpO2 97%   BMI 40.61 kg/m   Visual Acuity Right Eye Distance:   Left Eye Distance:   Bilateral Distance:    Right Eye Near:   Left Eye Near:    Bilateral Near:     Physical Exam Nursing notes and Vital Signs reviewed. Appearance:  Patient appears stated age, and in no acute distress Eyes:  Pupils are equal, round, and reactive to light and accomodation.  Extraocular movement is intact.  Conjunctivae are not inflamed  Ears:  Canals normal.  Tympanic membranes normal.  Nose:  Mildly congested turbinates.  No sinus tenderness.    Pharynx:  Normal Neck:  Supple.  Tender enlarged posterior/lateral nodes are palpated bilaterally  Lungs:  Rhonchi heard left chest.  Breath sounds are equal.  Moving air well. Heart:  Regular rate and rhythm without murmurs, rubs, or gallops.  Abdomen:  Nontender without masses or hepatosplenomegaly.  Bowel sounds are present.  No CVA or flank tenderness.  Extremities:  No edema.  Skin:   No rash present.    UC Treatments / Results  Labs (all labs ordered are listed, but only abnormal results are displayed) Labs Reviewed - No data to display  EKG  EKG Interpretation None       Radiology No results found.  Procedures Procedures (including critical care time)  Medications Ordered in UC Medications - No data to display   Initial Impression / Assessment and Plan / UC Course  I have reviewed the triage vital signs and the nursing notes.  Pertinent labs & imaging results that were available during my care of the patient were reviewed by me and considered in my medical decision making (see chart for details).  Clinical Course   ?early bacterial infection. Begin Z-pak for atypical coverage.  Rx for Robitussin AC for night time cough.  Take plain guaifenesin (1200mg  extended release tabs such as Mucinex) twice daily, with plenty of water, for cough and congestion.  May add Pseudoephedrine (30mg , one or two every 4 to 6 hours) for sinus congestion.  Get adequate rest.   Continue Mucinex nasal spray twice daily for about 5 days and then discontinue.  Also recommend using saline nasal spray several times daily and saline nasal irrigation (AYR is a common brand).  Use Flonase nasal spray each morning after using Afrin nasal spray and saline nasal irrigation. Try warm salt water gargles for sore throat.  Stop all antihistamines for now, and other non-prescription cough/cold preparations. May take Ibuprofen 200mg , 4 tabs every 8 hours with food for body aches, headache, etc.   Follow-up with family doctor if not improving about10 days.     Final Clinical Impressions(s) / UC Diagnoses   Final diagnoses:  Viral URI with cough    New Prescriptions New Prescriptions   AZITHROMYCIN (ZITHROMAX Z-PAK) 250 MG TABLET    Take 2 tabs today; then begin one tab once daily for 4 more days.   GUAIFENESIN-CODEINE 100-10 MG/5ML SYRUP    Take 10mL by mouth at bedtime as needed for  cough     Lattie HawStephen A Rajan Burgard, MD 08/24/16 1515

## 2016-08-17 NOTE — ED Triage Notes (Signed)
Reports productive cough and congestion for 5 days; has tried OTCs without relief.

## 2016-08-19 ENCOUNTER — Telehealth: Payer: Self-pay | Admitting: Emergency Medicine

## 2016-08-19 NOTE — Telephone Encounter (Signed)
Patient only slightly improved.

## 2016-08-29 ENCOUNTER — Emergency Department
Admission: EM | Admit: 2016-08-29 | Discharge: 2016-08-29 | Disposition: A | Discharge status: Home / Self Care | Payer: BLUE CROSS/BLUE SHIELD | Source: Home / Self Care | Attending: Family Medicine | Admitting: Family Medicine

## 2016-08-29 DIAGNOSIS — H9201 Otalgia, right ear: Secondary | ICD-10-CM | POA: Diagnosis not present

## 2016-08-29 DIAGNOSIS — J069 Acute upper respiratory infection, unspecified: Secondary | ICD-10-CM

## 2016-08-29 DIAGNOSIS — J3489 Other specified disorders of nose and nasal sinuses: Secondary | ICD-10-CM

## 2016-08-29 MED ORDER — PREDNISONE 20 MG PO TABS: ORAL_TABLET | ORAL | 0 refills | Status: DC

## 2016-08-29 MED ORDER — DOXYCYCLINE HYCLATE 100 MG PO CAPS: 100.0000 mg | ORAL_CAPSULE | Freq: Two times a day (BID) | ORAL | 0 refills | Status: DC

## 2016-08-29 MED ORDER — SALINE SPRAY 0.65 % NA SOLN: 1.0000 | NASAL | 0 refills | Status: DC | PRN

## 2016-08-29 NOTE — ED Triage Notes (Signed)
Here on 11/30 with cough and sinus drainage.  On medication after that visit, and felt better.  Started with sore throat, and right ear pain 2 days ago.

## 2016-08-29 NOTE — Discharge Instructions (Signed)
°  Your symptoms are likely due to a virus such as the common cold, however, if you developing worsening ear or sinus pain and pressure, fever for 2-3 days >100.4*F, or other new concerning symptoms develop, go ahead and start taking the antibiotic-doxycyline and make sure to take the entire course even if you start to feel better.

## 2016-08-29 NOTE — ED Provider Notes (Signed)
CSN: [id number]     Arrival date & time 08/29/16  1737 History   First MD Initiated Contact with Patient 08/29/16 1810     Chief Complaint  Patient presents with  . Cough  . Sore Throat   (Consider location/radiation/quality/duration/timing/severity/associated sxs/prior Treatment) HPI  Samuel BoucheJohnny Klinck is a 45 y.o. male presenting to UC with c/o Right ear pain that started 2 days ago along with sore throat. He was seen on 11/30 for URI symptoms including a cough, completed a course of azithromycin, felt better but is now having worsening sinus pain and pressure.  Right ear is aching and throbbing. Denies fever, chills, n/v/d.    Past Medical History:  Diagnosis Date  . IBS (irritable bowel syndrome)    Past Surgical History:  Procedure Laterality Date  . KNEE ARTHROSCOPY W/ ACL RECONSTRUCTION     left  . MOUTH SURGERY    . VASECTOMY     Family History  Problem Relation Age of Onset  . Cancer Mother     breast  . Heart disease Father    Social History  Substance Use Topics  . Smoking status: Current Every Day Smoker    Packs/day: 1.00    Years: 28.00    Types: Cigarettes  . Smokeless tobacco: Never Used  . Alcohol use Yes     Comment: occassionally    Review of Systems  Constitutional: Negative for chills and fever.  HENT: Positive for congestion, ear pain (Right) and sore throat. Negative for trouble swallowing and voice change.   Respiratory: Positive for cough. Negative for shortness of breath.   Cardiovascular: Negative for chest pain and palpitations.  Gastrointestinal: Negative for abdominal pain, diarrhea, nausea and vomiting.  Musculoskeletal: Negative for arthralgias, back pain and myalgias.  Skin: Negative for rash.    Allergies  Patient has no known allergies.  Home Medications   Prior to Admission medications   Medication Sig Start Date End Date Taking? Authorizing Provider  azithromycin (ZITHROMAX Z-PAK) 250 MG tablet Take 2 tabs today; then begin  one tab once daily for 4 more days. 08/17/16   Lattie HawStephen A Beese, MD  doxycycline (VIBRAMYCIN) 100 MG capsule Take 1 capsule (100 mg total) by mouth 2 (two) times daily. One po bid x 7 days 08/29/16   Junius FinnerErin O'Malley, PA-C  guaiFENesin-codeine 100-10 MG/5ML syrup Take 10mL by mouth at bedtime as needed for cough 08/17/16   Lattie HawStephen A Beese, MD  predniSONE (DELTASONE) 20 MG tablet 3 tabs po day one, then 2 po daily x 4 days 08/29/16   Junius FinnerErin O'Malley, PA-C  sodium chloride (OCEAN) 0.65 % SOLN nasal spray Place 1 spray into both nostrils as needed. 08/29/16   Junius FinnerErin O'Malley, PA-C   Meds Ordered and Administered this Visit  Medications - No data to display  BP 131/87 (BP Location: Left Arm)   Pulse 88   Temp 98.3 F (36.8 C) (Oral)   Ht 5\' 9"  (1.753 m)   Wt 275 lb (124.7 kg)   SpO2 96%   BMI 40.61 kg/m  No data found.   Physical Exam  Constitutional: He appears well-developed and well-nourished. No distress.  HENT:  Head: Normocephalic and atraumatic.  Right Ear: A middle ear effusion is present.  Left Ear: Tympanic membrane normal.  Nose: Mucosal edema present. Right sinus exhibits maxillary sinus tenderness. Right sinus exhibits no frontal sinus tenderness. Left sinus exhibits no maxillary sinus tenderness and no frontal sinus tenderness.  Mouth/Throat: Uvula is midline and mucous membranes are  normal. Posterior oropharyngeal erythema present. No oropharyngeal exudate, posterior oropharyngeal edema or tonsillar abscesses.  Eyes: Conjunctivae are normal. No scleral icterus.  Neck: Normal range of motion. Neck supple.  Cardiovascular: Normal rate, regular rhythm and normal heart sounds.   Pulmonary/Chest: Effort normal and breath sounds normal. No stridor. No respiratory distress. He has no wheezes. He has no rales.  Abdominal: Soft. He exhibits no distension. There is no tenderness.  Musculoskeletal: Normal range of motion.  Lymphadenopathy:    He has cervical adenopathy ( Right side).   Neurological: He is alert.  Skin: Skin is warm and dry. He is not diaphoretic.  Nursing note and vitals reviewed.   Urgent Care Course   Clinical Course     Procedures (including critical care time)  Labs Review Labs Reviewed - No data to display  Imaging Review No results found.  Tympanometry: Normal in Left and Right ear.  MDM   1. Upper respiratory tract infection, unspecified type   2. Right ear pain   3. Sinus pressure    Pt c/o sore throat and Right ear pain. No evidence of bacterial infection at this time. Encouraged to try trial of prednisone, nasal saline and sinus rinses. Prescription to hold for doxycycline with expiration date for potential sinus infection (to fill if sinus pain and pressure continues to worsen, fever develops)   Junius Finnerrin O'Malley, PA-C 08/30/16 1408

## 2018-02-27 ENCOUNTER — Other Ambulatory Visit: Payer: Self-pay

## 2018-02-27 ENCOUNTER — Emergency Department (INDEPENDENT_AMBULATORY_CARE_PROVIDER_SITE_OTHER): Payer: BLUE CROSS/BLUE SHIELD

## 2018-02-27 ENCOUNTER — Encounter: Payer: Self-pay | Admitting: *Deleted

## 2018-02-27 ENCOUNTER — Emergency Department
Admission: EM | Admit: 2018-02-27 | Discharge: 2018-02-27 | Disposition: A | Discharge status: Home / Self Care | Payer: BLUE CROSS/BLUE SHIELD | Source: Home / Self Care | Attending: Family Medicine | Admitting: Family Medicine

## 2018-02-27 DIAGNOSIS — R079 Chest pain, unspecified: Secondary | ICD-10-CM

## 2018-02-27 DIAGNOSIS — R0600 Dyspnea, unspecified: Secondary | ICD-10-CM

## 2018-02-27 DIAGNOSIS — F17210 Nicotine dependence, cigarettes, uncomplicated: Secondary | ICD-10-CM

## 2018-02-27 DIAGNOSIS — R5383 Other fatigue: Secondary | ICD-10-CM

## 2018-02-27 DIAGNOSIS — M940 Chondrocostal junction syndrome [Tietze]: Secondary | ICD-10-CM | POA: Diagnosis not present

## 2018-02-27 MED ORDER — IBUPROFEN 800 MG PO TABS
800.0000 mg | ORAL_TABLET | ORAL | Status: AC
  Administered 2018-02-27: 800 mg via ORAL

## 2018-02-27 MED ORDER — PREDNISONE 50 MG PO TABS: ORAL_TABLET | ORAL | 0 refills | Status: DC

## 2018-02-27 NOTE — Discharge Instructions (Signed)
Put ice on the painful area. Put ice in a plastic bag. Place a towel between your skin and the bag. Leave the ice on for 20 minutes, 2-3 times a day. 

## 2018-02-27 NOTE — ED Provider Notes (Signed)
[facility]    CSN: [id number] Arrival date & time: 02/27/18  1704     History   Chief Complaint Chief Complaint  Patient presents with  . Chest Pain    HPI Stanley Munoz is a 47 y.o. male.   Patient reports that he awoke five days ago with tightness and pressure in the center of his chest, and sensation of shortness of breath.  He has had a persistent feeling of not being able to take a full deep breath.  He recalls no injury to his chest.  He denies cough or recent URI.  No fevers, chills, and sweats.  He denies indigestion or reflux symptoms.  He continues to smoke.  The history is provided by the patient.  Chest Pain  Pain location:  Substernal area Pain quality: aching   Pain radiates to:  Mid back Pain severity:  Mild Onset quality:  Sudden Duration:  5 days Timing:  Constant Progression:  Unchanged Chronicity:  New Context: breathing, movement and at rest   Context: not trauma   Relieved by:  None tried Worsened by:  Deep breathing Ineffective treatments:  None tried Associated symptoms: anxiety, back pain, lower extremity edema and shortness of breath   Associated symptoms: no abdominal pain, no AICD problem, no anorexia, no claudication, no cough, no diaphoresis, no dizziness, no dysphagia, no fatigue, no fever, no heartburn, no nausea, no near-syncope, no orthopnea, no palpitations and no syncope   Risk factors: no prior DVT/PE     Past Medical History:  Diagnosis Date  . IBS (irritable bowel syndrome)     Patient Active Problem List   Diagnosis Date Noted  . Right thigh pain 02/10/2015  . OTITIS EXTERNA, ACUTE, LEFT 05/11/2010  . OTITIS MEDIA, SEROUS, ACUTE, RIGHT 05/11/2010  . OTITIS MEDIA, SUPPURATIVE, ACUTE, LEFT 05/11/2010  . URI 10/08/2009  . NAUSEA ALONE 10/08/2009    Past Surgical History:  Procedure Laterality Date  . KNEE ARTHROSCOPY W/ ACL RECONSTRUCTION     left  . MOUTH SURGERY    . VASECTOMY         Home  Medications    Prior to Admission medications   Medication Sig Start Date End Date Taking? Authorizing Provider  predniSONE (DELTASONE) 50 MG tablet Take one tab by mouth with food once daily for five days 02/27/18   Lattie Haw, MD    Family History Family History  Problem Relation Age of Onset  . Cancer Mother        breast  . Heart disease Father     Social History Social History   Tobacco Use  . Smoking status: Current Every Day Smoker    Packs/day: 1.00    Years: 28.00    Pack years: 28.00    Types: Cigarettes  . Smokeless tobacco: Never Used  Substance Use Topics  . Alcohol use: Yes    Comment: occassionally  . Drug use: No     Allergies   Patient has no known allergies.   Review of Systems Review of Systems  Constitutional: Negative for diaphoresis, fatigue and fever.  HENT: Negative for trouble swallowing.   Respiratory: Positive for shortness of breath. Negative for cough.   Cardiovascular: Positive for chest pain. Negative for palpitations, orthopnea, claudication, syncope and near-syncope.  Gastrointestinal: Negative for abdominal pain, anorexia, heartburn and nausea.  Musculoskeletal: Positive for back pain.  Neurological: Negative for dizziness.  All other systems reviewed and are negative.    Physical Exam Triage Vital  Signs ED Triage Vitals  Enc Vitals Group     BP 02/27/18 1716 (!) 147/97     Pulse Rate 02/27/18 1716 82     Resp 02/27/18 1716 18     Temp 02/27/18 1716 98 F (36.7 C)     Temp Source 02/27/18 1716 Oral     SpO2 02/27/18 1716 98 %     Weight --      Height --      Head Circumference --      Peak Flow --      Pain Score 02/27/18 1717 2     Pain Loc --      Pain Edu? --      Excl. in GC? --    No data found.  Updated Vital Signs BP (!) 147/97 (BP Location: Right Arm)   Pulse 82   Temp 98 F (36.7 C) (Oral)   Resp 18   SpO2 98%   Visual Acuity Right Eye Distance:   Left Eye Distance:   Bilateral  Distance:    Right Eye Near:   Left Eye Near:    Bilateral Near:     Physical Exam  Constitutional: He appears well-developed and well-nourished.  HENT:  Head: Normocephalic.  Right Ear: External ear normal.  Left Ear: External ear normal.  Nose: Nose normal.  Mouth/Throat: Oropharynx is clear and moist.  Eyes: Pupils are equal, round, and reactive to light. Conjunctivae are normal.  Neck: Normal range of motion.  Cardiovascular: Normal heart sounds.  Pulmonary/Chest: Breath sounds normal. He exhibits tenderness.  There is distinct point tenderness to palpation over the xiphoid.  Palpation there recreates his pain.    Abdominal: Soft. Bowel sounds are normal. There is no tenderness.  Musculoskeletal:  Trace lower leg edema  Neurological: He is alert.  Skin: Skin is warm and dry. No rash noted.  Nursing note and vitals reviewed.    UC Treatments / Results  Labs (all labs ordered are listed, but only abnormal results are displayed) Labs Reviewed - No data to display  EKG  Rate:  79 BPM PR:  156 msec QT:  384 msec QTcH:  440 msec QRSD:  88 msec QRS axis:  37 degrees Interpretation:  within normal limits; normal sinus rhythm. Comparison with EKG done 01/27/16 shows no change  Radiology Dg Chest 2 View  Result Date: 02/27/2018 CLINICAL DATA:  Dyspnea, fatigue and chest pressure x4 days. Positive smoking history of 1 pack per day. EXAM: CHEST - 2 VIEW COMPARISON:  01/27/2016 FINDINGS: The heart size and mediastinal contours are within normal limits. Chronic bronchitic change of the lungs. Both lungs are free of pulmonary consolidations. There is minimal atelectasis at the left lung base. The visualized skeletal structures are unremarkable. IMPRESSION: Mild bronchitic change of the lungs. Electronically Signed   By: Tollie Eth M.D.   On: 02/27/2018 18:00    Procedures Procedures (including critical care time)  Medications Ordered in UC Medications - No data to  display  Initial Impression / Assessment and Plan / UC Course  I have reviewed the triage vital signs and the nursing notes.  Pertinent labs & imaging results that were available during my care of the patient were reviewed by me and considered in my medical decision making (see chart for details).    Chest X-ray shows no significant changes from previous. Normal EKG reassuring. Begin prednisone burst. Followup with Dr. Rodney Langton or Dr. Clement[facility] ([facility]) if not improving about  two weeks.    Final Clinical Impressions(s) / UC Diagnoses   Final diagnoses:  Costochondritis     Discharge Instructions      Put ice on the painful area: ? Put ice in a plastic bag. ? Place a towel between your skin and the bag. ? Leave the ice on for 20 minutes, 2-3 times a day.    ED Prescriptions    Medication Sig Dispense Auth. Provider   predniSONE (DELTASONE) 50 MG tablet Take one tab by mouth with food once daily for five days 5 tablet Lattie HawBeese, Dayzha Pogosyan A, MD         Lattie HawBeese, Bronislaw Switzer A, MD 02/27/18 [phone]

## 2018-02-27 NOTE — ED Triage Notes (Signed)
Pt reports that he awoke during the night with pressure in center of his chest , pain in back, and SOB x 4 days ago. He also c/o ankle swelling yesterday.

## 2018-03-01 ENCOUNTER — Telehealth: Payer: Self-pay

## 2018-03-01 NOTE — Telephone Encounter (Signed)
Spoke with patient, still feeling SOB, pain in back is resolved.  Encouraged to follow up with PCP if continues.

## 2018-09-30 ENCOUNTER — Emergency Department
Admission: EM | Admit: 2018-09-30 | Discharge: 2018-09-30 | Disposition: A | Discharge status: Home / Self Care | Payer: BLUE CROSS/BLUE SHIELD | Source: Home / Self Care | Attending: Family Medicine | Admitting: Family Medicine

## 2018-09-30 ENCOUNTER — Encounter: Payer: Self-pay | Admitting: Emergency Medicine

## 2018-09-30 ENCOUNTER — Other Ambulatory Visit: Payer: Self-pay

## 2018-09-30 DIAGNOSIS — J209 Acute bronchitis, unspecified: Secondary | ICD-10-CM | POA: Diagnosis not present

## 2018-09-30 MED ORDER — METHYLPREDNISOLONE ACETATE 80 MG/ML IJ SUSP
80.0000 mg | Freq: Once | INTRAMUSCULAR | Status: AC
  Administered 2018-09-30: 80 mg via INTRAMUSCULAR

## 2018-09-30 MED ORDER — BENZONATATE 200 MG PO CAPS: ORAL_CAPSULE | ORAL | 0 refills | Status: AC

## 2018-09-30 MED ORDER — PREDNISONE 20 MG PO TABS: ORAL_TABLET | ORAL | 0 refills | Status: DC

## 2018-09-30 MED ORDER — DOXYCYCLINE HYCLATE 100 MG PO CAPS: 100.0000 mg | ORAL_CAPSULE | Freq: Two times a day (BID) | ORAL | 0 refills | Status: DC

## 2018-09-30 NOTE — ED Triage Notes (Signed)
Productive cough x 2 weeks

## 2018-09-30 NOTE — ED Provider Notes (Signed)
[facility]    CSN: [id number] Arrival date & time: 09/30/18  1617     History   Chief Complaint Chief Complaint  Patient presents with  . Cough    HPI Stanley Munoz is a 48 y.o. male.   About 2.5 weeks ago patient developed typical cold-like symptoms developing over several days, including mild sore throat, sinus congestion, fatigue, and cough.  The sinus congestion improved, but his cough has persisted, now worse at night.  During the past two days he has developed sweats and increased fatigue.  He denies pleuritic pain.  He continues to smoke.  The history is provided by the patient.    Past Medical History:  Diagnosis Date  . IBS (irritable bowel syndrome)     Patient Active Problem List   Diagnosis Date Noted  . Right thigh pain 02/10/2015  . OTITIS EXTERNA, ACUTE, LEFT 05/11/2010  . OTITIS MEDIA, SEROUS, ACUTE, RIGHT 05/11/2010  . OTITIS MEDIA, SUPPURATIVE, ACUTE, LEFT 05/11/2010  . URI 10/08/2009  . NAUSEA ALONE 10/08/2009    Past Surgical History:  Procedure Laterality Date  . KNEE ARTHROSCOPY W/ ACL RECONSTRUCTION     left  . MOUTH SURGERY    . VASECTOMY         Home Medications    Prior to Admission medications   Medication Sig Start Date End Date Taking? Authorizing Provider  dextromethorphan (DELSYM) 30 MG/5ML liquid Take by mouth as needed for cough.   Yes [provider]  dextromethorphan-guaiFENesin (MUCINEX DM) 30-600 MG 12hr tablet Take 1 tablet by mouth 2 (two) times daily.   Yes [provider]  benzonatate (TESSALON) 200 MG capsule Take one cap by mouth at bedtime as needed for cough.  May repeat in 4 to 6 hours 09/30/18   Stanley Munoz, Wisdom Rickey A, MD  doxycycline (VIBRAMYCIN) 100 MG capsule Take 1 capsule (100 mg total) by mouth 2 (two) times daily. Take with food. 09/30/18   Stanley Munoz, Stanley Munoz A, MD  predniSONE (DELTASONE) 20 MG tablet Take one tab by mouth twice daily for 5 days, then one daily for 3 days. Take with  food. 09/30/18   Stanley Munoz, Richell Corker A, MD    Family History Family History  Problem Relation Age of Onset  . Cancer Mother        breast  . Heart disease Father     Social History Social History   Tobacco Use  . Smoking status: Current Every Day Smoker    Packs/day: 1.00    Years: 28.00    Pack years: 28.00    Types: Cigarettes  . Smokeless tobacco: Never Used  Substance Use Topics  . Alcohol use: Yes    Comment: occassionally  . Drug use: No     Allergies   Patient has no known allergies.   Review of Systems Review of Systems + sore throat, resolved + cough No pleuritic pain No wheezing + nasal congestion + post-nasal drainage No sinus pain/pressure No itchy/red eyes No earache No hemoptysis + SOB No fever, + chills/sweats No nausea No vomiting No abdominal pain No diarrhea No urinary symptoms No skin rash + fatigue No myalgias No headache Used OTC meds without relief   Physical Exam Triage Vital Signs ED Triage Vitals  Enc Vitals Group     BP 09/30/18 1628 (!) 160/97     Pulse Rate 09/30/18 1628 78     Resp --      Temp 09/30/18 1628 98.7 F (37.1 C)  Temp Source 09/30/18 1628 Oral     SpO2 09/30/18 1628 97 %     Weight 09/30/18 1629 282 lb (127.9 kg)     Height 09/30/18 1629 5\' 9"  (1.753 m)     Head Circumference --      Peak Flow --      Pain Score 09/30/18 1629 0     Pain Loc --      Pain Edu? --      Excl. in GC? --    No data found.  Updated Vital Signs BP (!) 160/97 (BP Location: Right Arm)   Pulse 78   Temp 98.7 F (37.1 C) (Oral)   Ht 5\' 9"  (1.753 m)   Wt 127.9 kg   SpO2 97%   BMI 41.64 kg/m   Visual Acuity Right Eye Distance:   Left Eye Distance:   Bilateral Distance:    Right Eye Near:   Left Eye Near:    Bilateral Near:     Physical Exam Nursing notes and Vital Signs reviewed. Appearance:  Patient appears stated age, and in no acute distress Eyes:  Pupils are equal, round, and reactive to light and  accomodation.  Extraocular movement is intact.  Conjunctivae are not inflamed  Ears:  Canals normal.  Tympanic membranes normal.  Nose:  Mildly congested turbinates.  No sinus tenderness.  Pharynx:  Normal Neck:  Supple.  Enlarged nontender posterior/lateral nodes are palpated bilaterally  Lungs:  Bibasilar expiratory wheezes present.  Breath sounds are equal.  Moving air well. Heart:  Regular rate and rhythm without murmurs, rubs, or gallops.  Abdomen:  Nontender without masses or hepatosplenomegaly.  Bowel sounds are present.  No CVA or flank tenderness.  Extremities:  No edema.  Skin:  No rash present.    UC Treatments / Results  Labs (all labs ordered are listed, but only abnormal results are displayed) Labs Reviewed - No data to display  EKG None  Radiology No results found.  Procedures Procedures (including critical care time)  Medications Ordered in UC Medications  methylPREDNISolone acetate (DEPO-MEDROL) injection 80 mg (80 mg Intramuscular Given 09/30/18 1659)    Initial Impression / Assessment and Plan / UC Course  I have reviewed the triage vital signs and the nursing notes.  Pertinent labs & imaging results that were available during my care of the patient were reviewed by me and considered in my medical decision making (see chart for details).    Begin doxycycline 100mg  BID for 10 days. Administered Depo Medrol 80mg  IM.  Begin prednisone burst/taper. Prescription written for Benzonatate [facility](Tessalon) to take at bedtime for night-time cough.  Followup with Family Doctor if not improved in one week.   Final Clinical Impressions(s) / UC Diagnoses   Final diagnoses:  Acute bronchitis, unspecified organism     Discharge Instructions     Begin prednisone Tuesday 10/01/18. Take plain guaifenesin (1200mg  extended release tabs such as Mucinex) twice daily, with plenty of water, for cough and congestion.  Get adequate rest.   May use Afrin nasal spray (or generic  oxymetazoline) each morning for about 5 days and then discontinue.  Also recommend using saline nasal spray several times daily and saline nasal irrigation (AYR is a common brand).  Use Flonase nasal spray each morning after using Afrin nasal spray and saline nasal irrigation. Stop all antihistamines for now, and other non-prescription cough/cold preparations. May take Delsym Cough Suppressant with Tessalon at bedtime for nighttime cough.       ED  Prescriptions    Medication Sig Dispense Auth. Provider   predniSONE (DELTASONE) 20 MG tablet Take one tab by mouth twice daily for 5 days, then one daily for 3 days. Take with food. 13 tablet Stanley Haw, MD   doxycycline (VIBRAMYCIN) 100 MG capsule Take 1 capsule (100 mg total) by mouth 2 (two) times daily. Take with food. 20 capsule Stanley Haw, MD   benzonatate (TESSALON) 200 MG capsule Take one cap by mouth at bedtime as needed for cough.  May repeat in 4 to 6 hours 15 capsule Cathren Harsh Tera Mater, MD         Stanley Haw, MD 09/30/18 [phone]

## 2018-09-30 NOTE — Discharge Instructions (Addendum)
Begin prednisone Tuesday 10/01/18. Take plain guaifenesin (1200mg  extended release tabs such as Mucinex) twice daily, with plenty of water, for cough and congestion.  Get adequate rest.   May use Afrin nasal spray (or generic oxymetazoline) each morning for about 5 days and then discontinue.  Also recommend using saline nasal spray several times daily and saline nasal irrigation (AYR is a common brand).  Use Flonase nasal spray each morning after using Afrin nasal spray and saline nasal irrigation. Stop all antihistamines for now, and other non-prescription cough/cold preparations. May take Delsym Cough Suppressant with Tessalon at bedtime for nighttime cough.

## 2019-07-27 DIAGNOSIS — Z20828 Contact with and (suspected) exposure to other viral communicable diseases: Secondary | ICD-10-CM | POA: Diagnosis not present

## 2019-07-27 DIAGNOSIS — J3489 Other specified disorders of nose and nasal sinuses: Secondary | ICD-10-CM | POA: Diagnosis not present

## 2019-07-27 DIAGNOSIS — R0981 Nasal congestion: Secondary | ICD-10-CM | POA: Diagnosis not present

## 2019-07-27 DIAGNOSIS — R05 Cough: Secondary | ICD-10-CM | POA: Diagnosis not present

## 2019-12-17 ENCOUNTER — Emergency Department
Admission: EM | Admit: 2019-12-17 | Discharge: 2019-12-17 | Disposition: A | Discharge status: Home / Self Care | Payer: BC Managed Care – PPO | Source: Home / Self Care | Attending: Family Medicine | Admitting: Family Medicine

## 2019-12-17 ENCOUNTER — Encounter: Payer: Self-pay | Admitting: Emergency Medicine

## 2019-12-17 DIAGNOSIS — B353 Tinea pedis: Secondary | ICD-10-CM | POA: Diagnosis not present

## 2019-12-17 MED ORDER — FLUCONAZOLE 100 MG PO TABS: ORAL_TABLET | ORAL | 0 refills | Status: AC

## 2019-12-17 MED ORDER — DOXYCYCLINE HYCLATE 100 MG PO CAPS: 100.0000 mg | ORAL_CAPSULE | Freq: Two times a day (BID) | ORAL | 0 refills | Status: AC

## 2019-12-17 MED ORDER — AMMONIUM LACTATE 12 % EX LOTN: TOPICAL_LOTION | CUTANEOUS | 0 refills | Status: AC

## 2019-12-17 NOTE — ED Provider Notes (Signed)
[facility]    CSN: [id number] Arrival date & time: 12/17/19  1503      History   Chief Complaint Chief Complaint  Patient presents with  . Rash    bilat feet/heels  . Recurrent Skin Infections    left foot    HPI Stanley Munoz is a 49 y.o. male.   Patient complains of onset of one week history of painful blisters along the margins of his feet, and increased redness on the dorsum of his left foot.  His symptoms have not improved after applying Tinactin.  The history is provided by the patient.  Rash Location: both feet. Quality: blistering, itchiness, painful, redness and swelling   Quality: not weeping   Pain details:    Quality:  Itching and burning   Severity:  Mild   Onset quality:  Sudden   Duration:  1 week   Timing:  Constant   Progression:  Worsening Severity:  Moderate Onset quality:  Sudden Duration:  1 week Timing:  Constant Progression:  Worsening Chronicity:  New Context: not animal contact, not chemical exposure, not exposure to similar rash, not food, not hot tub use, not insect bite/sting, not medications, not new detergent/soap and not plant contact   Relieved by:  Nothing Worsened by:  Nothing Ineffective treatments: Tinactin. Associated symptoms: no fever, no induration, no joint pain and no myalgias     Past Medical History:  Diagnosis Date  . IBS (irritable bowel syndrome)     Patient Active Problem List   Diagnosis Date Noted  . Right thigh pain 02/10/2015  . OTITIS EXTERNA, ACUTE, LEFT 05/11/2010  . OTITIS MEDIA, SEROUS, ACUTE, RIGHT 05/11/2010  . OTITIS MEDIA, SUPPURATIVE, ACUTE, LEFT 05/11/2010  . URI 10/08/2009  . NAUSEA ALONE 10/08/2009    Past Surgical History:  Procedure Laterality Date  . KNEE ARTHROSCOPY W/ ACL RECONSTRUCTION     left  . MOUTH SURGERY    . VASECTOMY         Home Medications    Prior to Admission medications   Medication Sig Start Date End Date Taking? Authorizing Provider    Multiple Vitamin (MULTIVITAMIN) tablet Take 1 tablet by mouth daily.   Yes [provider]  NIACIN-50 PO Take by mouth.   Yes [provider]  ammonium lactate (AMLACTIN) 12 % lotion Apply to affected area daily 12/17/19 12/16/20  Lattie Haw, MD  benzonatate (TESSALON) 200 MG capsule Take one cap by mouth at bedtime as needed for cough.  May repeat in 4 to 6 hours 09/30/18   Lattie Haw, MD  dextromethorphan (DELSYM) 30 MG/5ML liquid Take by mouth as needed for cough.    [provider]  dextromethorphan-guaiFENesin (MUCINEX DM) 30-600 MG 12hr tablet Take 1 tablet by mouth 2 (two) times daily.    [provider]  doxycycline (VIBRAMYCIN) 100 MG capsule Take 1 capsule (100 mg total) by mouth 2 (two) times daily. Take with food. 12/17/19   Lattie Haw, MD  fluconazole (DIFLUCAN) 100 MG tablet Take one tab by mouth once daily. 12/17/19   Lattie Haw, MD  predniSONE (DELTASONE) 20 MG tablet Take one tab by mouth twice daily for 5 days, then one daily for 3 days. Take with food. 09/30/18   Lattie Haw, MD    Family History Family History  Problem Relation Age of Onset  . Cancer Mother        breast  . Heart disease Father  Social History Social History   Tobacco Use  . Smoking status: Current Every Day Smoker    Packs/day: 1.00    Years: 29.00    Pack years: 29.00    Types: Cigarettes  . Smokeless tobacco: Never Used  Substance Use Topics  . Alcohol use: Yes    Comment: occassionally  . Drug use: No     Allergies   Patient has no known allergies.   Review of Systems Review of Systems  Constitutional: Negative for fever.  Musculoskeletal: Negative for arthralgias and myalgias.  Skin: Positive for rash.  All other systems reviewed and are negative.    Physical Exam Triage Vital Signs ED Triage Vitals  Enc Vitals Group     BP 12/17/19 1515 (!) 166/107     Pulse Rate 12/17/19 1515 83     Resp 12/17/19 1515 20      Temp 12/17/19 1515 99.1 F (37.3 C)     Temp Source 12/17/19 1515 Oral     SpO2 12/17/19 1515 99 %     Weight --      Height --      Head Circumference --      Peak Flow --      Pain Score 12/17/19 1522 3     Pain Loc --      Pain Edu? --      Excl. in [place]? --    No data found.  Updated Vital Signs BP (!) 166/107 (BP Location: Left Arm)   Pulse 83   Temp 99.1 F (37.3 C) (Oral)   Resp 20   SpO2 99%   Visual Acuity Right Eye Distance:   Left Eye Distance:   Bilateral Distance:    Right Eye Near:   Left Eye Near:    Bilateral Near:     Physical Exam Vitals and nursing note reviewed.  Constitutional:      General: He is not in acute distress. Eyes:     Pupils: Pupils are equal, round, and reactive to light.  Cardiovascular:     Rate and Rhythm: Normal rate.  Pulmonary:     Effort: Pulmonary effort is normal.  Musculoskeletal:     Right foot: No tenderness.     Left foot: No tenderness.       Feet:     Comments: Both feet have scattered vesicles around lateral and posterior margins.  Distal dorsum of left foot is erythematous without induration, tenderness or fluctuance. Plantar surfaces of both feet are hyperkeratotic and scaly.  Skin:    General: Skin is warm and dry.  Neurological:     Mental Status: He is alert.      UC Treatments / Results  Labs (all labs ordered are listed, but only abnormal results are displayed) Labs Reviewed - No data to display  EKG   Radiology No results found.  Procedures Procedures (including critical care time)  Medications Ordered in UC Medications - No data to display  Initial Impression / Assessment and Plan / UC Course  I have reviewed the triage vital signs and the nursing notes.  Pertinent labs & imaging results that were available during my care of the patient were reviewed by me and considered in my medical decision making (see chart for details).    Suspect tinea pedis with secondary bacterial  infection. Begin diflucan 100mg  daily for 3 weeks, and doxycycline 100mg  bid for one week. Apply Lac-hydrin to hyperkeratotic plantar surfaces at night. Followup with dermatologist if not  resolving within three weeks.  Note elevated blood pressure today Recommend follow-up with PCP for hypertension. Final Clinical Impressions(s) / UC Diagnoses   Final diagnoses:  Tinea pedis of both feet     Discharge Instructions     Wear clean dry socks each day.    ED Prescriptions    Medication Sig Dispense Auth. Provider   doxycycline (VIBRAMYCIN) 100 MG capsule Take 1 capsule (100 mg total) by mouth 2 (two) times daily. Take with food. 20 capsule Lattie Haw, MD   ammonium lactate (AMLACTIN) 12 % lotion Apply to affected area daily 225 g Lattie Haw, MD   fluconazole (DIFLUCAN) 100 MG tablet Take one tab by mouth once daily. 21 tablet Lattie Haw, MD        Lattie Haw, MD 12/19/19 [phone]

## 2019-12-17 NOTE — Discharge Instructions (Addendum)
Wear clean dry socks each day.

## 2019-12-17 NOTE — ED Triage Notes (Signed)
Cellulitis vs rash to bilat feet- blisters to heels Top of left foot reddish-purple x 1 week (c/o dryness) Pain to heels where blisters are located

## 2019-12-22 DIAGNOSIS — L308 Other specified dermatitis: Secondary | ICD-10-CM | POA: Diagnosis not present

## 2019-12-22 DIAGNOSIS — L299 Pruritus, unspecified: Secondary | ICD-10-CM | POA: Diagnosis not present

## 2019-12-22 DIAGNOSIS — D485 Neoplasm of uncertain behavior of skin: Secondary | ICD-10-CM | POA: Diagnosis not present

## 2019-12-22 DIAGNOSIS — L209 Atopic dermatitis, unspecified: Secondary | ICD-10-CM | POA: Diagnosis not present

## 2020-01-05 DIAGNOSIS — L400 Psoriasis vulgaris: Secondary | ICD-10-CM | POA: Diagnosis not present

## 2020-05-26 ENCOUNTER — Emergency Department
Admission: EM | Admit: 2020-05-26 | Discharge: 2020-05-26 | Disposition: A | Discharge status: Home / Self Care | Payer: BC Managed Care – PPO | Source: Home / Self Care | Attending: Family Medicine | Admitting: Family Medicine

## 2020-05-26 ENCOUNTER — Other Ambulatory Visit: Payer: Self-pay

## 2020-05-26 DIAGNOSIS — I10 Essential (primary) hypertension: Secondary | ICD-10-CM

## 2020-05-26 LAB — POCT CBC W AUTO DIFF (K'VILLE URGENT CARE)

## 2020-05-26 LAB — POCT URINALYSIS DIP (MANUAL ENTRY)
Bilirubin, UA: NEGATIVE
Blood, UA: NEGATIVE
Glucose, UA: NEGATIVE mg/dL
Ketones, POC UA: NEGATIVE mg/dL
Leukocytes, UA: NEGATIVE
Nitrite, UA: NEGATIVE
Protein Ur, POC: NEGATIVE mg/dL
Spec Grav, UA: 1.025 (ref 1.010–1.025)
Urobilinogen, UA: 0.2 E.U./dL
pH, UA: 5.5 (ref 5.0–8.0)

## 2020-05-26 MED ORDER — ACETAMINOPHEN 325 MG PO TABS
650.0000 mg | ORAL_TABLET | Freq: Four times a day (QID) | ORAL | Status: DC | PRN
  Administered 2020-05-26: 650 mg via ORAL

## 2020-05-26 MED ORDER — LOSARTAN POTASSIUM 50 MG PO TABS: ORAL_TABLET | ORAL | 1 refills | Status: DC

## 2020-05-26 NOTE — Discharge Instructions (Addendum)
Please monitor your blood pressure several times weekly, at different times of the day, record on a calendar with times of measurement and take this record to your Family Doctor.  

## 2020-05-26 NOTE — ED Provider Notes (Signed)
[facility]    CSN: [id number] Arrival date & time: 05/26/20  1814      History   Chief Complaint Chief Complaint  Patient presents with  . Hypertension    HPI Stanley Munoz is a 49 y.o. male.   Patient states that he had an elevated BP reading of 170/122 at home today, reporting that he has been under increased stress recently.  He has had vague blurry vision and feeling like he is in a "fog."  He denies neuro symptoms at present. He continues to smoke. He has a family history of hypertension (father) and paternal grandmother had an MI. Review of past records reveals that he has had consistently elevate blood pressure measurements on previous visits.  The history is provided by the patient.    Past Medical History:  Diagnosis Date  . IBS (irritable bowel syndrome)     Patient Active Problem List   Diagnosis Date Noted  . Right thigh pain 02/10/2015  . OTITIS EXTERNA, ACUTE, LEFT 05/11/2010  . OTITIS MEDIA, SEROUS, ACUTE, RIGHT 05/11/2010  . OTITIS MEDIA, SUPPURATIVE, ACUTE, LEFT 05/11/2010  . URI 10/08/2009  . NAUSEA ALONE 10/08/2009    Past Surgical History:  Procedure Laterality Date  . KNEE ARTHROSCOPY W/ ACL RECONSTRUCTION     left  . MOUTH SURGERY    . VASECTOMY         Home Medications    Prior to Admission medications   Medication Sig Start Date End Date Taking? Authorizing Provider  ammonium lactate (AMLACTIN) 12 % lotion Apply to affected area daily 12/17/19 12/16/20  Lattie Haw, MD  benzonatate (TESSALON) 200 MG capsule Take one cap by mouth at bedtime as needed for cough.  May repeat in 4 to 6 hours 09/30/18   Lattie Haw, MD  dextromethorphan (DELSYM) 30 MG/5ML liquid Take by mouth as needed for cough.    [provider]  dextromethorphan-guaiFENesin (MUCINEX DM) 30-600 MG 12hr tablet Take 1 tablet by mouth 2 (two) times daily.    [provider]  doxycycline (VIBRAMYCIN) 100 MG capsule Take 1 capsule (100  mg total) by mouth 2 (two) times daily. Take with food. 12/17/19   Lattie Haw, MD  fluconazole (DIFLUCAN) 100 MG tablet Take one tab by mouth once daily. 12/17/19   Lattie Haw, MD  losartan (COZAAR) 50 MG tablet Take one tab PO once daily for Blood Pressure 05/26/20   Lattie Haw, MD  Multiple Vitamin (MULTIVITAMIN) tablet Take 1 tablet by mouth daily.    [provider]  NIACIN-50 PO Take by mouth.    [provider]  predniSONE (DELTASONE) 20 MG tablet Take one tab by mouth twice daily for 5 days, then one daily for 3 days. Take with food. 09/30/18   Lattie Haw, MD    Family History Family History  Problem Relation Age of Onset  . Cancer Mother        breast  . Heart disease Father     Social History Social History   Tobacco Use  . Smoking status: Current Every Day Smoker    Packs/day: 1.25    Years: 29.00    Pack years: 36.25    Types: Cigarettes  . Smokeless tobacco: Never Used  . Tobacco comment: going through a bad divorce  Vaping Use  . Vaping Use: Never used  Substance Use Topics  . Alcohol use: Yes    Comment: occassionally  . Drug use: No  Allergies   Patient has no known allergies.   Review of Systems Review of Systems  Constitutional: Negative for activity change, appetite change, chills, diaphoresis, fatigue, fever and unexpected weight change.  HENT: Negative.   Eyes: Positive for visual disturbance.  Respiratory: Negative.   Cardiovascular: Negative.   Gastrointestinal: Negative.   Genitourinary: Negative.   Musculoskeletal: Negative.   Skin: Negative.   Neurological: Negative for dizziness, tremors, syncope, facial asymmetry, speech difficulty, weakness, light-headedness, numbness and headaches.     Physical Exam Triage Vital Signs ED Triage Vitals  Enc Vitals Group     BP 05/26/20 1828 (!) 164/110     Pulse Rate 05/26/20 1828 80     Resp 05/26/20 1828 16     Temp 05/26/20 1828 98.5 F (36.9 C)      Temp Source 05/26/20 1828 Oral     SpO2 05/26/20 1828 100 %     Weight --      Height --      Head Circumference --      Peak Flow --      Pain Score 05/26/20 1826 0     Pain Loc --      Pain Edu? --      Excl. in [place]? --    No data found.  Updated Vital Signs BP (!) 164/110 (BP Location: Right Arm)   Pulse 80   Temp 98.5 F (36.9 C) (Oral)   Resp 16   SpO2 100%   Visual Acuity Right Eye Distance:   Left Eye Distance:   Bilateral Distance:    Right Eye Near:   Left Eye Near:    Bilateral Near:     Physical Exam Nursing notes and Vital Signs reviewed. Appearance:  Patient appears stated age, and in no acute distress.  He is obese. Eyes:  Pupils are equal, round, and reactive to light and accomodation.  Extraocular movement is intact.  Conjunctivae are not inflamed.  Fundi benign Ears:  Canals normal.  Tympanic membranes normal.  Nose:  Normal turbinates.  Pharynx:  Normal Neck:  Supple.  No thyromegaly or adenopathy.  Carotids have normal upstrokes.   Lungs:  Clear to auscultation.  Breath sounds are equal.  Moving air well. Heart:  Regular rate and rhythm without murmurs, rubs, or gallops.  Abdomen:  Nontender without masses or hepatosplenomegaly.  Bowel sounds are present.  No CVA or flank tenderness.  Extremities:  No edema.  Skin:  No rash present.  Neurologic:  Cranial nerves 2 through 12 are normal.   UC Treatments / Results  Labs (all labs ordered are listed, but only abnormal results are displayed) Labs Reviewed  COMPLETE METABOLIC PANEL WITH GFR  POCT CBC W AUTO DIFF ([facility]):  WBC 11.0; LY 21.4; MO 4.9; GR 73.7; Hgb 17.3; Platelets 237   POCT URINALYSIS DIP (MANUAL ENTRY)    EKG   Radiology No results found.  Procedures Procedures (including critical care time)  Medications Ordered in UC Medications  acetaminophen (TYLENOL) tablet 650 mg (650 mg Oral Given 05/26/20 1955)    Initial Impression / Assessment and Plan / UC Course  I  have reviewed the triage vital signs and the nursing notes.  Pertinent labs & imaging results that were available during my care of the patient were reviewed by me and considered in my medical decision making (see chart for details).    Uncontrolled hypertension. Recommend smoking cessation. Lab tests pending as above. Begin losartan 50mg  daily. Followup with  Family Doctor in 7 to 10 days for additional work-up.   Final Clinical Impressions(s) / UC Diagnoses   Final diagnoses:  Essential hypertension     Discharge Instructions     Please monitor your blood pressure several times weekly, at different times of the day, record on a calendar with times of measurement and take this record to your Family Doctor.     ED Prescriptions    Medication Sig Dispense Auth. Provider   losartan (COZAAR) 50 MG tablet Take one tab PO once daily for Blood Pressure 15 tablet Lattie Haw, MD        Lattie Haw, MD 05/29/20 1341

## 2020-05-26 NOTE — ED Triage Notes (Signed)
Patient presents to Urgent Care with complaints of HTN since earlier today. Patient reports he has had a lot of stress recently, BP 164/110 upon arrival, checked by this RN while the pt was getting registered. Pt had a reading of 170/122 at home, endorses blurry vision and feeling like he is in a fog, No neuro symptoms at this time.

## 2020-05-27 LAB — COMPLETE METABOLIC PANEL WITH GFR
AG Ratio: 1.6 (calc) (ref 1.0–2.5)
ALT: 29 U/L (ref 9–46)
AST: 45 U/L — ABNORMAL HIGH (ref 10–40)
Albumin: 4.6 g/dL (ref 3.6–5.1)
Alkaline phosphatase (APISO): 69 U/L (ref 36–130)
BUN: 11 mg/dL (ref 7–25)
CO2: 26 mmol/L (ref 20–32)
Calcium: 9.5 mg/dL (ref 8.6–10.3)
Chloride: 101 mmol/L (ref 98–110)
Creat: 1.1 mg/dL (ref 0.60–1.35)
GFR, Est African American: 91 mL/min/{1.73_m2} (ref >=60)
GFR, Est Non African American: 78 mL/min/{1.73_m2} (ref >=60)
Globulin: 2.9 g/dL (calc) (ref 1.9–3.7)
Glucose, Bld: 92 mg/dL (ref 65–99)
Potassium: 4.1 mmol/L (ref 3.5–5.3)
Sodium: 137 mmol/L (ref 135–146)
Total Bilirubin: 0.7 mg/dL (ref 0.2–1.2)
Total Protein: 7.5 g/dL (ref 6.1–8.1)

## 2020-06-01 ENCOUNTER — Emergency Department (INDEPENDENT_AMBULATORY_CARE_PROVIDER_SITE_OTHER): Payer: BC Managed Care – PPO

## 2020-06-01 ENCOUNTER — Emergency Department (INDEPENDENT_AMBULATORY_CARE_PROVIDER_SITE_OTHER)
Admission: EM | Admit: 2020-06-01 | Discharge: 2020-06-01 | Disposition: A | Discharge status: Home / Self Care | Payer: BC Managed Care – PPO | Source: Home / Self Care | Attending: Family Medicine | Admitting: Family Medicine

## 2020-06-01 ENCOUNTER — Other Ambulatory Visit: Payer: Self-pay

## 2020-06-01 DIAGNOSIS — M545 Low back pain: Secondary | ICD-10-CM

## 2020-06-01 DIAGNOSIS — M533 Sacrococcygeal disorders, not elsewhere classified: Secondary | ICD-10-CM | POA: Diagnosis not present

## 2020-06-01 MED ORDER — CYCLOBENZAPRINE HCL 10 MG PO TABS: 10.0000 mg | ORAL_TABLET | Freq: Three times a day (TID) | ORAL | 0 refills | Status: AC | PRN

## 2020-06-01 MED ORDER — PREDNISONE 20 MG PO TABS: ORAL_TABLET | ORAL | 0 refills | Status: AC

## 2020-06-01 NOTE — ED Provider Notes (Signed)
[facility]    CSN: [id number] Arrival date & time: 06/01/20  1336      History   Chief Complaint Chief Complaint  Patient presents with  . Back Pain    HPI Stanley Munoz is a 49 y.o. male.   Patient developed non-radiating pain in his left lower back one week ago without preceding injury or change in activities.  The pain has persisted, now worse this morning.   He denies bowel or bladder dysfunction, and no saddle numbness.  He denies urinary symptoms, and has no history of kidney stones.      Pt c/o lower left sided back pain x 1 week. Pt said it was the worst this morning, could hardly get out of bed. No hx of kidney stones or UTI. Pain 7/10 Ibuprofen prn, heat and ice tried no relief.  The history is provided by the patient.  Back Pain Location:  Sacro-iliac joint and lumbar spine Quality:  Stabbing Radiates to:  Does not radiate Pain severity:  Moderate Pain is:  Same all the time Onset quality:  Gradual Duration:  1 week Timing:  Constant Progression:  Worsening Chronicity:  New Context: not lifting heavy objects and not recent injury   Relieved by:  Nothing Worsened by:  Bending, movement and ambulation Ineffective treatments:  Ibuprofen, heating pad and cold packs Associated symptoms: no abdominal pain, no abdominal swelling, no bladder incontinence, no bowel incontinence, no dysuria, no fever, no leg pain, no numbness, no paresthesias, no pelvic pain, no perianal numbness, no tingling, no weakness and no weight loss   Risk factors: lack of exercise and obesity     Past Medical History:  Diagnosis Date  . IBS (irritable bowel syndrome)     Patient Active Problem List   Diagnosis Date Noted  . Right thigh pain 02/10/2015  . OTITIS EXTERNA, ACUTE, LEFT 05/11/2010  . OTITIS MEDIA, SEROUS, ACUTE, RIGHT 05/11/2010  . OTITIS MEDIA, SUPPURATIVE, ACUTE, LEFT 05/11/2010  . URI 10/08/2009  . NAUSEA ALONE 10/08/2009    Past Surgical History:    Procedure Laterality Date  . KNEE ARTHROSCOPY W/ ACL RECONSTRUCTION     left  . MOUTH SURGERY    . VASECTOMY         Home Medications    Prior to Admission medications   Medication Sig Start Date End Date Taking? Authorizing Provider  ammonium lactate (AMLACTIN) 12 % lotion Apply to affected area daily 12/17/19 12/16/20  Lattie Haw, MD  benzonatate (TESSALON) 200 MG capsule Take one cap by mouth at bedtime as needed for cough.  May repeat in 4 to 6 hours 09/30/18   Lattie Haw, MD  cyclobenzaprine (FLEXERIL) 10 MG tablet Take 1 tablet (10 mg total) by mouth 3 (three) times daily as needed for muscle spasms. 06/01/20   Lattie Haw, MD  dextromethorphan (DELSYM) 30 MG/5ML liquid Take by mouth as needed for cough.    [provider]  dextromethorphan-guaiFENesin (MUCINEX DM) 30-600 MG 12hr tablet Take 1 tablet by mouth 2 (two) times daily.    [provider]  doxycycline (VIBRAMYCIN) 100 MG capsule Take 1 capsule (100 mg total) by mouth 2 (two) times daily. Take with food. 12/17/19   Lattie Haw, MD  fluconazole (DIFLUCAN) 100 MG tablet Take one tab by mouth once daily. 12/17/19   Lattie Haw, MD  losartan (COZAAR) 50 MG tablet Take one tab PO once daily for Blood Pressure 05/26/20   Lattie Haw,  MD  Multiple Vitamin (MULTIVITAMIN) tablet Take 1 tablet by mouth daily.    [provider]  NIACIN-50 PO Take by mouth.    [provider]  predniSONE (DELTASONE) 20 MG tablet Take one tab by mouth twice daily for 5 days, then one daily. Take with food. 06/01/20   Lattie Haw, MD    Family History Family History  Problem Relation Age of Onset  . Cancer Mother        breast  . Heart disease Father     Social History Social History   Tobacco Use  . Smoking status: Current Every Day Smoker    Packs/day: 1.25    Years: 29.00    Pack years: 36.25    Types: Cigarettes  . Smokeless tobacco: Never Used  . Tobacco comment:  going through a bad divorce  Vaping Use  . Vaping Use: Never used  Substance Use Topics  . Alcohol use: Yes    Comment: occassionally  . Drug use: No     Allergies   Patient has no known allergies.   Review of Systems Review of Systems  Constitutional: Positive for activity change. Negative for appetite change, chills, diaphoresis, fatigue, fever and weight loss.  Gastrointestinal: Negative for abdominal pain and bowel incontinence.  Genitourinary: Negative for bladder incontinence, dysuria and pelvic pain.  Musculoskeletal: Positive for back pain.  Neurological: Negative for tingling, weakness, numbness and paresthesias.  All other systems reviewed and are negative.    Physical Exam Triage Vital Signs ED Triage Vitals  Enc Vitals Group     BP 06/01/20 1542 (!) 142/88     Pulse Rate 06/01/20 1542 83     Resp 06/01/20 1542 16     Temp 06/01/20 1542 98.6 F (37 C)     Temp Source 06/01/20 1542 Oral     SpO2 06/01/20 1542 97 %     Weight --      Height --      Head Circumference --      Peak Flow --      Pain Score 06/01/20 1538 7     Pain Loc --      Pain Edu? --      Excl. in [place]? --    No data found.  Updated Vital Signs BP (!) 142/88 (BP Location: Right Arm)   Pulse 83   Temp 98.6 F (37 C) (Oral)   Resp 16   SpO2 97%   Visual Acuity Right Eye Distance:   Left Eye Distance:   Bilateral Distance:    Right Eye Near:   Left Eye Near:    Bilateral Near:     Physical Exam Constitutional:      General: He is not in acute distress.    Appearance: He is obese.  HENT:     Head: Normocephalic.     Nose: Nose normal.  Eyes:     Conjunctiva/sclera: Conjunctivae normal.     Pupils: Pupils are equal, round, and reactive to light.  Cardiovascular:     Rate and Rhythm: Normal rate and regular rhythm.     Heart sounds: Normal heart sounds.  Pulmonary:     Breath sounds: Normal breath sounds.  Abdominal:     Tenderness: There is no abdominal tenderness.    Musculoskeletal:     Cervical back: Normal range of motion.       Back:     Right lower leg: No edema.     Left lower  leg: No edema.     Comments: Back:  Can heel/toe walk and squat without difficulty.  Decreased range of motion.  Distinct tenderness over the right SI joint.  Straight leg raising test is negative.  Sitting knee extension test is negative.  Strength and sensation in the lower extremities is normal.  Patellar and achilles reflexes are normal   Skin:    General: Skin is warm and dry.     Findings: No rash.  Neurological:     Mental Status: He is alert and oriented to person, place, and time.     Sensory: No sensory deficit.     Motor: No weakness.     Deep Tendon Reflexes: Reflexes normal.      UC Treatments / Results  Labs (all labs ordered are listed, but only abnormal results are displayed) Labs Reviewed - No data to display  EKG   Radiology DG Lumbar Spine Complete  Result Date: 06/01/2020 CLINICAL DATA:  Low back pain EXAM: LUMBAR SPINE - COMPLETE 4+ VIEW COMPARISON:  None. FINDINGS: Five view radiograph lumbar spine demonstrates normal lumbar lordosis. No fracture or listhesis of the lumbar spine. Vertebral body height and intervertebral disc heights are preserved. Oblique views demonstrate no evidence of pars defect. The paraspinal soft tissues are unremarkable. IMPRESSION: No fracture or listhesis of the lumbar spine. No significant degenerative changes. Electronically Signed   By: Helyn Numbers MD   On: 06/01/2020 17:40    Procedures Procedures (including critical care time)  Medications Ordered in UC Medications - No data to display  Initial Impression / Assessment and Plan / UC Course  I have reviewed the triage vital signs and the nursing notes.  Pertinent labs & imaging results that were available during my care of the patient were reviewed by me and considered in my medical decision making (see chart for details).    Begin prednisone  burst/taper.  Rx for Flexeril. Followup with Dr[facility] if not improving about two weeks.    Final Clinical Impressions(s) / UC Diagnoses   Final diagnoses:  SI (sacroiliac) joint dysfunction     Discharge Instructions     Apply ice pack for 20 to 30 minutes, 3 to 4 times daily  Continue until pain and swelling decrease.  Begin range of motion and stretching exercises as tolerated.    ED Prescriptions    Medication Sig Dispense Auth. Provider   predniSONE (DELTASONE) 20 MG tablet Take one tab by mouth twice daily for 5 days, then one daily. Take with food. 14 tablet Lattie Haw, MD   cyclobenzaprine (FLEXERIL) 10 MG tablet Take 1 tablet (10 mg total) by mouth 3 (three) times daily as needed for muscle spasms. 20 tablet Lattie Haw, MD        Lattie Haw, MD 06/03/20 [phone]

## 2020-06-01 NOTE — ED Triage Notes (Signed)
Pt c/o lower left sided back pain x 1 week. Pt said it was the worst this morning, could hardly get out of bed. No hx of kidney stones or UTI. Pain 7/10 Ibuprofen prn, heat and ice tried no relief.

## 2020-06-01 NOTE — Discharge Instructions (Addendum)
Apply ice pack for 20 to 30 minutes, 3 to 4 times daily  Continue until pain and swelling decrease.  Begin range of motion and stretching exercises as tolerated. 

## 2020-06-07 ENCOUNTER — Other Ambulatory Visit: Payer: Self-pay

## 2020-06-07 ENCOUNTER — Encounter: Payer: Self-pay | Admitting: Family Medicine

## 2020-06-07 ENCOUNTER — Ambulatory Visit (INDEPENDENT_AMBULATORY_CARE_PROVIDER_SITE_OTHER): Payer: BC Managed Care – PPO | Admitting: Family Medicine

## 2020-06-07 DIAGNOSIS — I10 Essential (primary) hypertension: Secondary | ICD-10-CM

## 2020-06-07 DIAGNOSIS — M533 Sacrococcygeal disorders, not elsewhere classified: Secondary | ICD-10-CM

## 2020-06-07 MED ORDER — LOSARTAN POTASSIUM 50 MG PO TABS: ORAL_TABLET | ORAL | 1 refills | Status: AC

## 2020-06-07 NOTE — Progress Notes (Signed)
Stanley Munoz - 49 y.o. male MRN [id number]  Date of birth: 09-18-1971  Subjective Chief Complaint  Patient presents with  . Establish Care    HPI Stanley Munoz is a 49 y.o. male here today for initial visit.  He has a history of HTN.  Seen at urgent care earlier this month with elevated BP and started on losartan.  Overall he is tolerating this well.  He does have some rare episodes of dizziness.  He denies symptoms related to HTN including chest pain, shortness of breath, palpitations, headache or vision changes.    He was also seen for SI/low back pain.  He has been taking  Prednisone and muscle relaxer as needed.  Symptoms have improved with this.  No radiation of pain, numbness or weakness.   ROS:  A comprehensive ROS was completed and negative except as noted per HPI  No Known Allergies  Past Medical History:  Diagnosis Date  . Anxiety   . Hypertension   . IBS (irritable bowel syndrome)     Past Surgical History:  Procedure Laterality Date  . KNEE ARTHROSCOPY W/ ACL RECONSTRUCTION     left  . MOUTH SURGERY    . VASECTOMY      Social History   Socioeconomic History  . Marital status: Legally Separated    Spouse name: Not on file  . Number of children: Not on file  . Years of education: Not on file  . Highest education level: Not on file  Occupational History  . Not on file  Tobacco Use  . Smoking status: Current Every Day Smoker    Packs/day: 1.50    Years: 29.00    Pack years: 43.50    Types: Cigarettes  . Smokeless tobacco: Never Used  . Tobacco comment: going through a bad divorce  Vaping Use  . Vaping Use: Never used  Substance and Sexual Activity  . Alcohol use: Yes    Comment: Rarely  . Drug use: No  . Sexual activity: Yes    Birth control/protection: Surgical  Other Topics Concern  . Not on file  Social History Narrative  . Not on file   Social Determinants of Health   Financial Resource Strain:   . Difficulty of Paying Living Expenses:  Not on file  Food Insecurity:   . Worried About Programme researcher, broadcasting/film/video in the Last Year: Not on file  . Ran Out of Food in the Last Year: Not on file  Transportation Needs:   . Lack of Transportation (Medical): Not on file  . Lack of Transportation (Non-Medical): Not on file  Physical Activity:   . Days of Exercise per Week: Not on file  . Minutes of Exercise per Session: Not on file  Stress:   . Feeling of Stress : Not on file  Social Connections:   . Frequency of Communication with Friends and Family: Not on file  . Frequency of Social Gatherings with Friends and Family: Not on file  . Attends Religious Services: Not on file  . Active Member of Clubs or Organizations: Not on file  . Attends Banker Meetings: Not on file  . Marital Status: Not on file    Family History  Problem Relation Age of Onset  . Diabetes Mother   . Breast cancer Mother   . Heart disease Father   . Hypertension Father   . Hypertension Maternal Grandmother   . Hypertension Maternal Grandfather   . Hypertension Paternal Grandmother   .  Hypertension Paternal Grandfather     Health Maintenance  Topic Date Due  . Hepatitis C Screening  Never done  . COVID-19 Vaccine (1) Never done  . HIV Screening  Never done  . TETANUS/TDAP  Never done  . INFLUENZA VACCINE  Never done     ----------------------------------------------------------------------------------------------------------------------------------------------------------------------------------------------------------------- Physical Exam BP (!) 150/103 (BP Location: Right Arm, Patient Position: Sitting, Cuff Size: Large)   Pulse 85   Wt 268 lb (121.6 kg)   SpO2 97%   BMI 39.58 kg/m   Physical Exam Constitutional:      Appearance: Normal appearance.  HENT:     Head: Normocephalic and atraumatic.  Eyes:     General: No scleral icterus. Cardiovascular:     Rate and Rhythm: Normal rate.  Pulmonary:     Effort: Pulmonary  effort is normal.     Breath sounds: Normal breath sounds.  Musculoskeletal:     Cervical back: Neck supple.  Neurological:     General: No focal deficit present.     Mental Status: He is alert.  Psychiatric:        Mood and Affect: Mood normal.        Behavior: Behavior normal.     ------------------------------------------------------------------------------------------------------------------------------------------------------------------------------------------------------------------- Assessment and Plan  Essential hypertension BP remains elevated here in clinic however BP readings at home have been well controlled.  He will continue to monitor at home and we'll follow up in clinic in a few weeks when he has annual exam.  He is also having some ED issues that may be from medication and necessitate change at future appt.  Recommend low sodium diet as well and smoking cessation.    SI (sacroiliac) pain Improved with prednisone and muscle relaxer that he uses as needed.  Discussed PT if not resolving.    Meds ordered this encounter  Medications  . losartan (COZAAR) 50 MG tablet    Sig: Take one tab PO once daily for Blood Pressure    Dispense:  90 tablet    Refill:  1    No follow-ups on file.    This visit occurred during the SARS-CoV-2 public health emergency.  Safety protocols were in place, including screening questions prior to the visit, additional usage of staff PPE, and extensive cleaning of exam room while observing appropriate contact time as indicated for disinfecting solutions.

## 2020-06-07 NOTE — Assessment & Plan Note (Addendum)
BP remains elevated here in clinic however BP readings at home have been well controlled.  He will continue to monitor at home and we'll follow up in clinic in a few weeks when he has annual exam.  He is also having some ED issues that may be from medication and necessitate change at future appt.  Recommend low sodium diet as well and smoking cessation.

## 2020-06-07 NOTE — Assessment & Plan Note (Signed)
Improved with prednisone and muscle relaxer that he uses as needed.  Discussed PT if not resolving.

## 2020-06-15 ENCOUNTER — Encounter: Payer: BC Managed Care – PPO | Admitting: Family Medicine

## 2020-07-08 DIAGNOSIS — R233 Spontaneous ecchymoses: Secondary | ICD-10-CM | POA: Diagnosis not present

## 2020-07-08 DIAGNOSIS — L400 Psoriasis vulgaris: Secondary | ICD-10-CM | POA: Diagnosis not present

## 2021-05-12 IMAGING — DX DG LUMBAR SPINE COMPLETE 4+V
5 series · 5 of 5 positions shown · non-contrast
Comparison: None.

CLINICAL DATA: Low back pain

EXAM:
LUMBAR SPINE - COMPLETE 4+ VIEW

[l-spine ap]
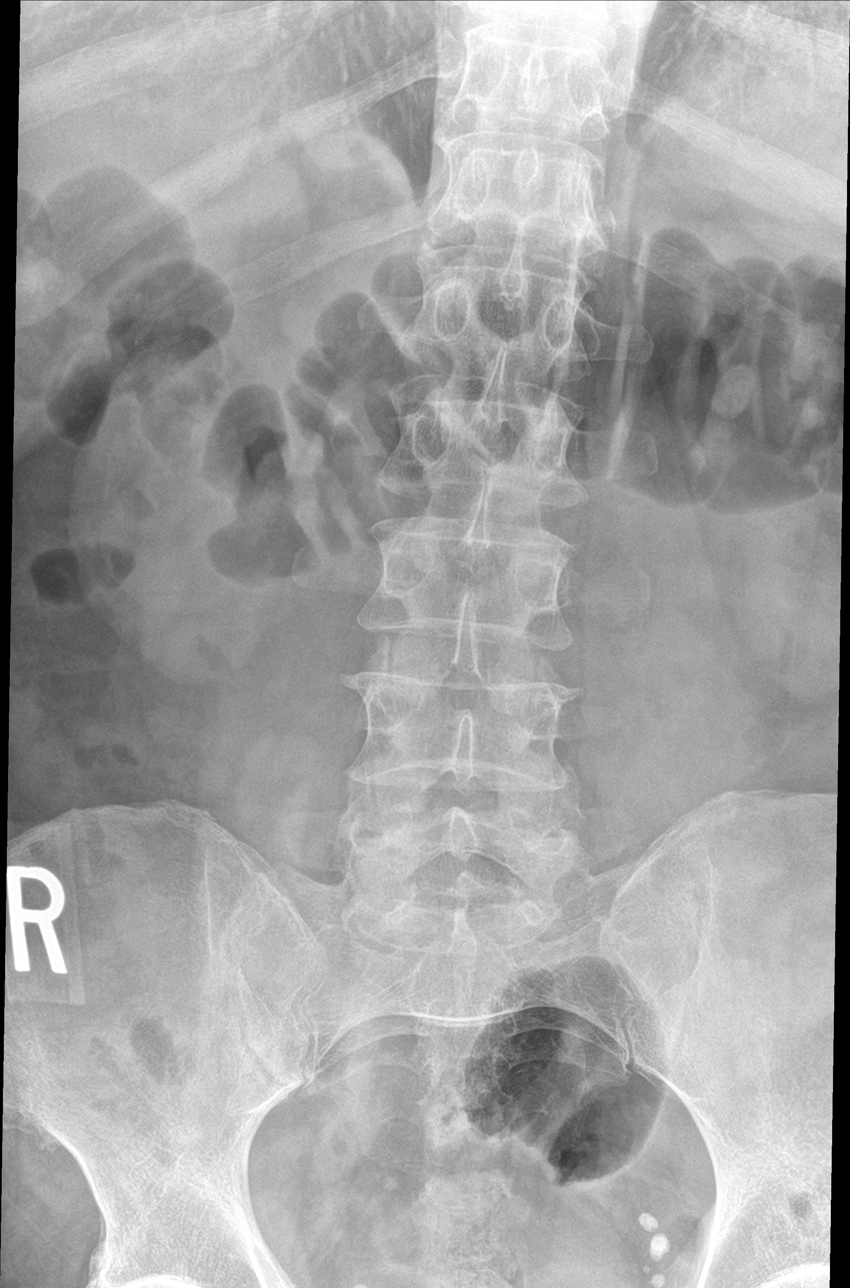

[l-spine obl (1 of 2)]
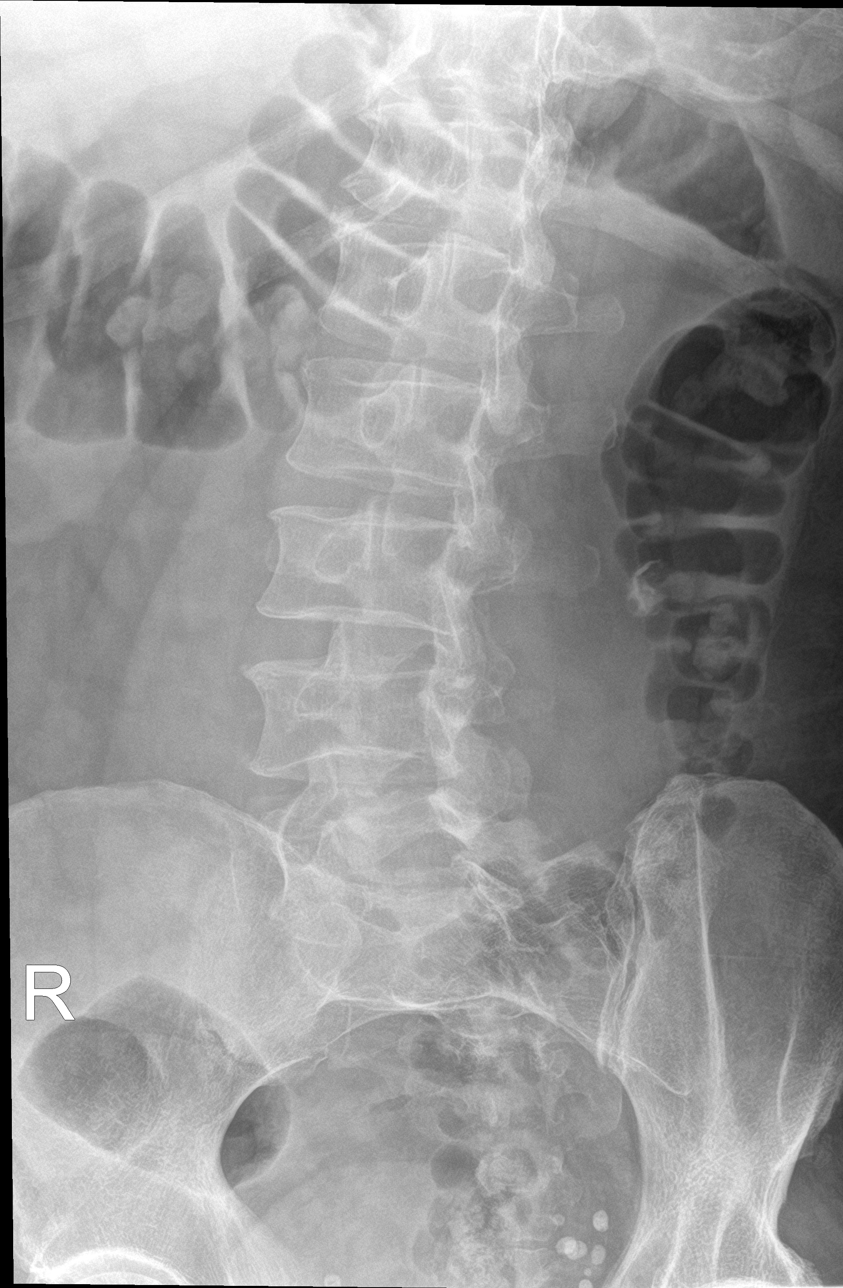

[l-spine obl (2 of 2)]
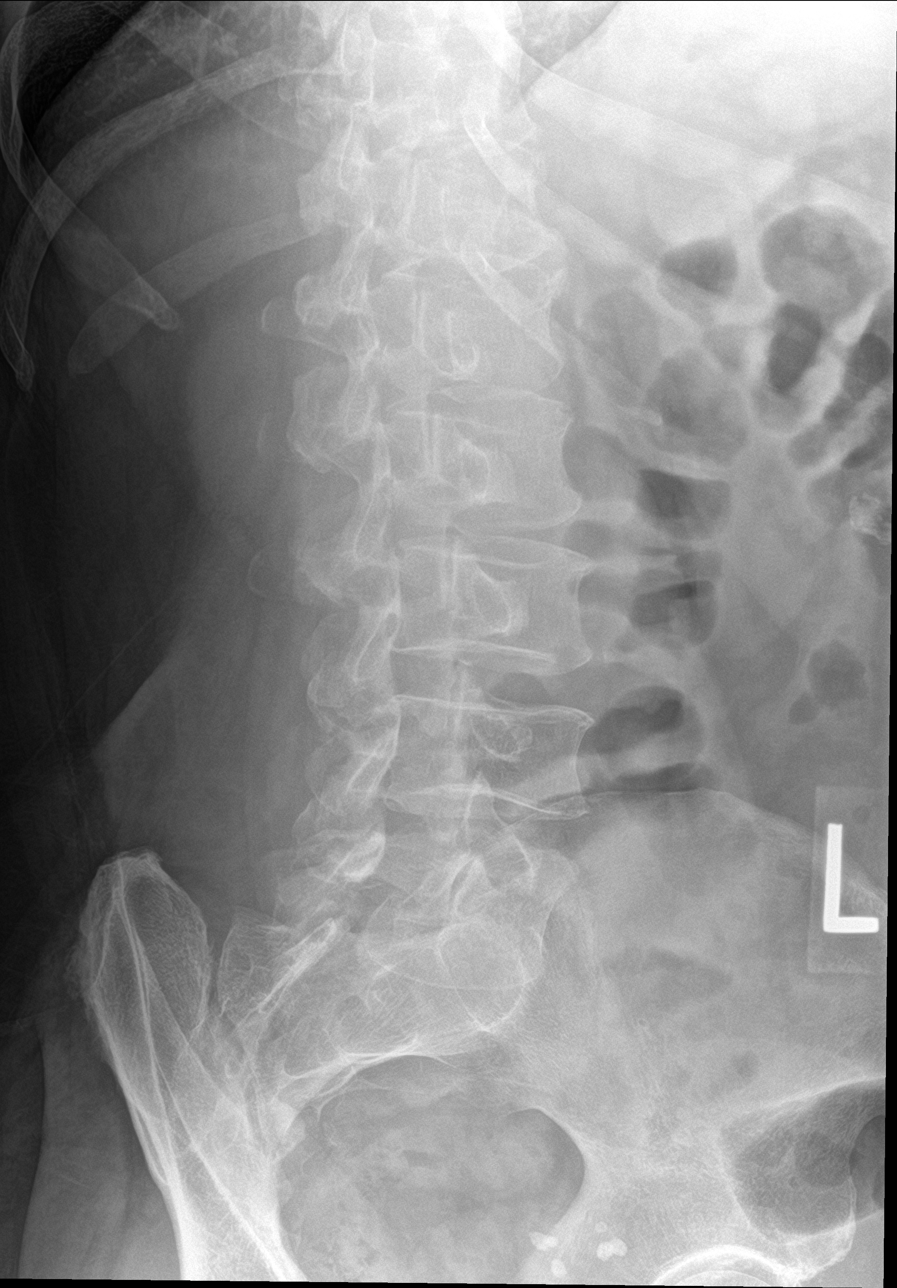

[l-spine lat]
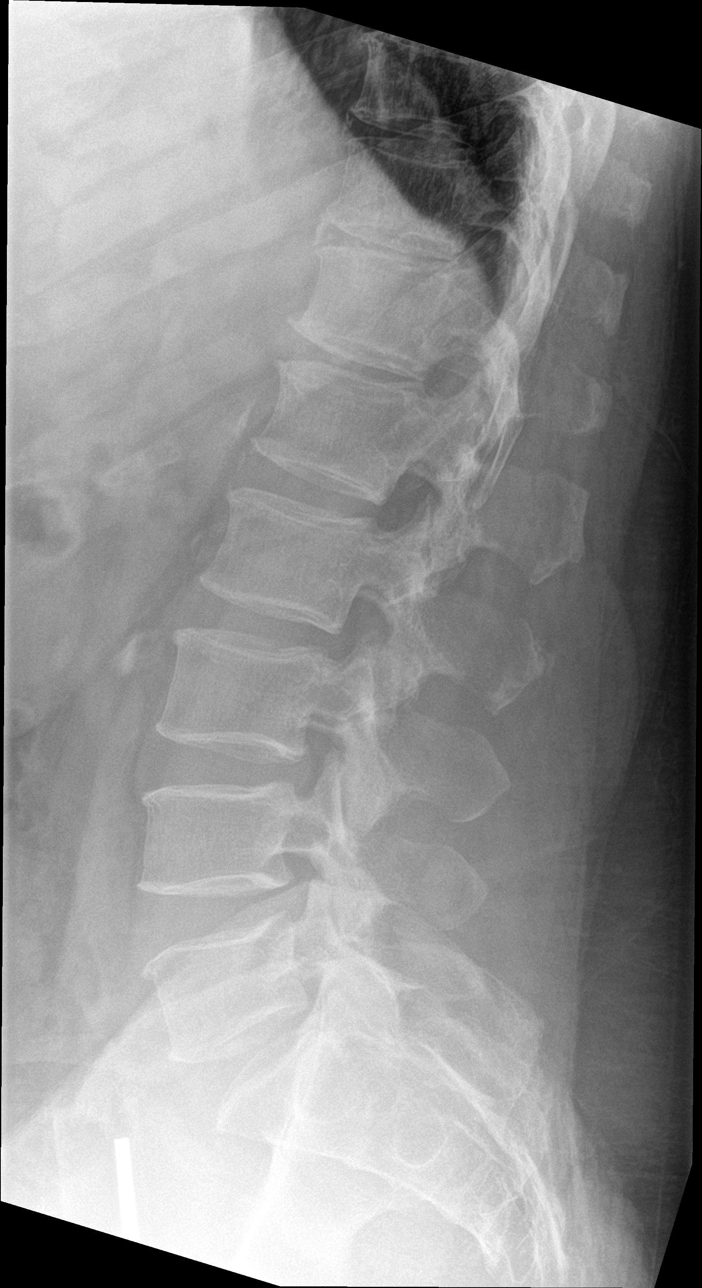

[l-spine spot]
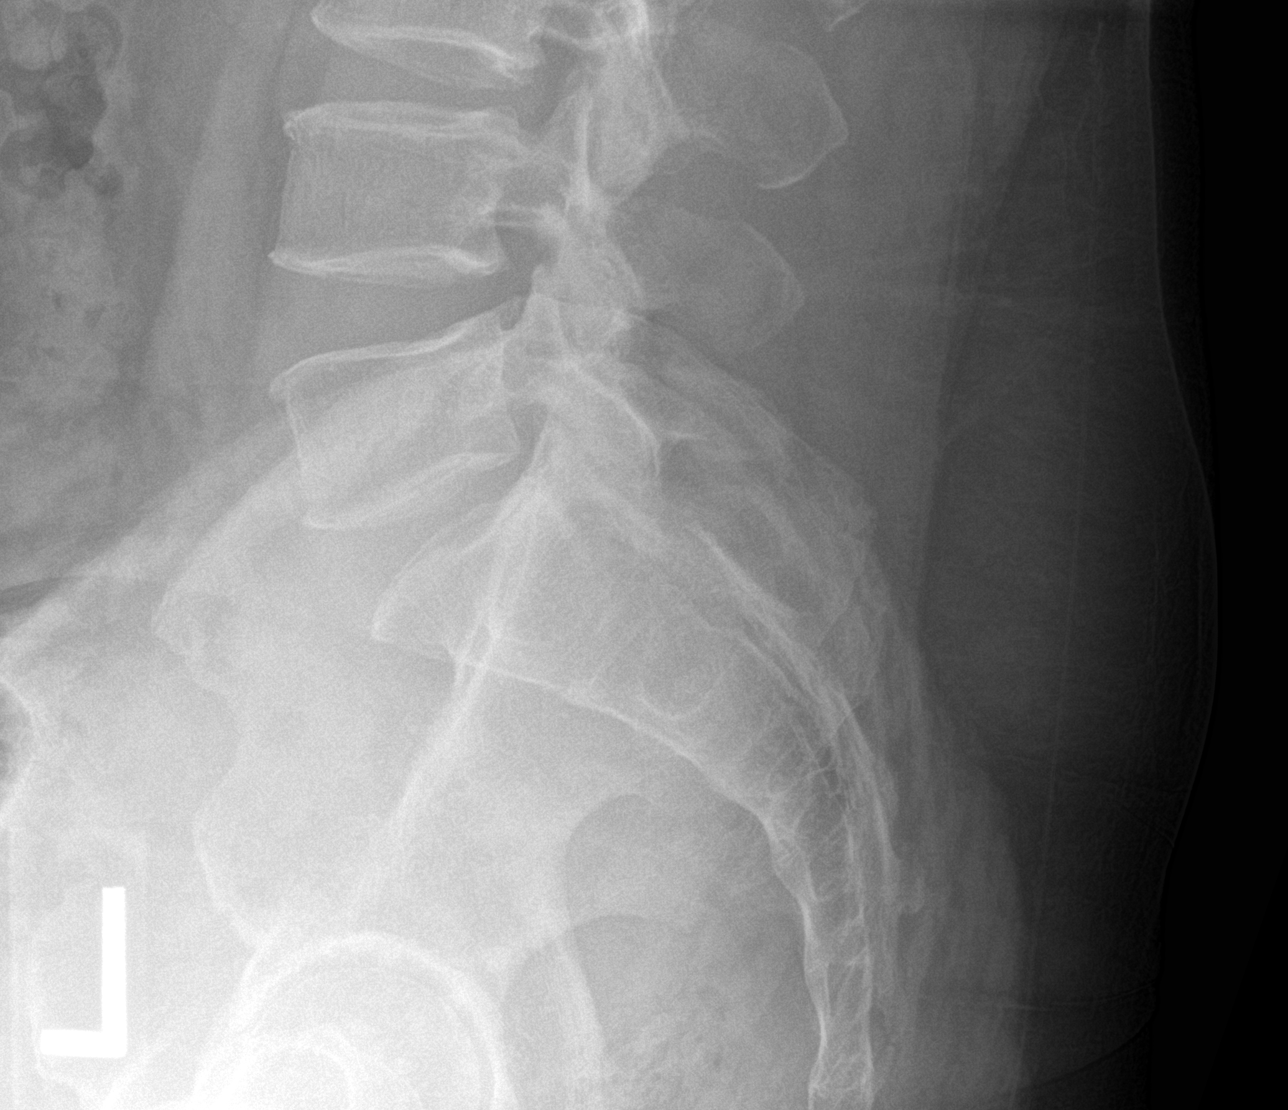

[5 of 5 positions shown; findings below may reference images not displayed]

FINDINGS: Five view radiograph lumbar spine demonstrates normal lumbar
lordosis. No fracture or listhesis of the lumbar spine. Vertebral
body height and intervertebral disc heights are preserved. Oblique
views demonstrate no evidence of pars defect. The paraspinal soft
tissues are unremarkable.
IMPRESSION: No fracture or listhesis of the lumbar spine. No significant
degenerative changes.
